# Patient Record
Sex: Male | Born: 1945 | Race: Black or African American | Hispanic: No | Smoking: Never smoker
Health system: Southern US, Community
[De-identification: ages and names within clinical notes are randomized; demographics above are authoritative.]

## PROBLEM LIST (undated history)

## (undated) DIAGNOSIS — I639 Cerebral infarction, unspecified: Secondary | ICD-10-CM

---

## 2000-10-29 ENCOUNTER — Emergency Department (HOSPITAL_COMMUNITY): Admission: EM | Admit: 2000-10-29 | Discharge: 2000-10-29 | Payer: Self-pay | Admitting: Emergency Medicine

## 2003-08-13 ENCOUNTER — Encounter: Payer: Self-pay | Admitting: Family Medicine

## 2003-08-13 ENCOUNTER — Encounter: Admission: RE | Admit: 2003-08-13 | Discharge: 2003-08-13 | Payer: Self-pay | Admitting: Family Medicine

## 2003-09-07 ENCOUNTER — Encounter: Admission: RE | Admit: 2003-09-07 | Discharge: 2003-09-07 | Payer: Self-pay | Admitting: Family Medicine

## 2003-09-14 ENCOUNTER — Encounter: Admission: RE | Admit: 2003-09-14 | Discharge: 2003-09-14 | Payer: Self-pay | Admitting: Family Medicine

## 2003-09-29 ENCOUNTER — Encounter: Admission: RE | Admit: 2003-09-29 | Discharge: 2003-09-29 | Payer: Self-pay | Admitting: Family Medicine

## 2004-02-11 ENCOUNTER — Encounter: Admission: RE | Admit: 2004-02-11 | Discharge: 2004-02-11 | Payer: Self-pay | Admitting: Emergency Medicine

## 2004-07-24 ENCOUNTER — Emergency Department (HOSPITAL_COMMUNITY): Admission: EM | Admit: 2004-07-24 | Discharge: 2004-07-24 | Payer: Self-pay | Admitting: Emergency Medicine

## 2008-07-10 ENCOUNTER — Emergency Department (HOSPITAL_COMMUNITY): Admission: EM | Admit: 2008-07-10 | Discharge: 2008-07-10 | Payer: Self-pay | Admitting: Emergency Medicine

## 2008-08-30 ENCOUNTER — Emergency Department (HOSPITAL_COMMUNITY): Admission: EM | Admit: 2008-08-30 | Discharge: 2008-08-30 | Payer: Self-pay | Admitting: Emergency Medicine

## 2010-08-12 ENCOUNTER — Emergency Department (HOSPITAL_COMMUNITY): Admission: EM | Admit: 2010-08-12 | Discharge: 2010-08-12 | Payer: Self-pay | Admitting: Emergency Medicine

## 2010-09-04 ENCOUNTER — Ambulatory Visit: Payer: Self-pay | Admitting: Internal Medicine

## 2010-09-04 ENCOUNTER — Inpatient Hospital Stay (HOSPITAL_COMMUNITY): Admission: EM | Admit: 2010-09-04 | Discharge: 2010-09-08 | Payer: Self-pay | Admitting: Emergency Medicine

## 2010-09-04 ENCOUNTER — Ambulatory Visit: Payer: Self-pay | Admitting: Cardiology

## 2010-09-05 ENCOUNTER — Encounter: Payer: Self-pay | Admitting: Internal Medicine

## 2010-09-06 ENCOUNTER — Ambulatory Visit: Payer: Self-pay | Admitting: Physical Medicine & Rehabilitation

## 2010-09-07 ENCOUNTER — Encounter: Payer: Self-pay | Admitting: Cardiology

## 2010-09-08 ENCOUNTER — Inpatient Hospital Stay (HOSPITAL_COMMUNITY)
Admission: RE | Admit: 2010-09-08 | Discharge: 2010-09-27 | Payer: Self-pay | Admitting: Physical Medicine & Rehabilitation

## 2010-09-08 ENCOUNTER — Encounter: Payer: Self-pay | Admitting: Internal Medicine

## 2010-09-08 DIAGNOSIS — I69959 Hemiplegia and hemiparesis following unspecified cerebrovascular disease affecting unspecified side: Secondary | ICD-10-CM | POA: Insufficient documentation

## 2010-09-09 ENCOUNTER — Ambulatory Visit: Payer: Self-pay | Admitting: Physical Medicine & Rehabilitation

## 2010-10-16 ENCOUNTER — Ambulatory Visit: Payer: Self-pay | Admitting: Internal Medicine

## 2010-10-16 DIAGNOSIS — M549 Dorsalgia, unspecified: Secondary | ICD-10-CM | POA: Insufficient documentation

## 2010-10-16 DIAGNOSIS — E785 Hyperlipidemia, unspecified: Secondary | ICD-10-CM | POA: Insufficient documentation

## 2010-10-18 ENCOUNTER — Encounter
Admission: RE | Admit: 2010-10-18 | Discharge: 2010-10-24 | Payer: Self-pay | Source: Home / Self Care | Attending: Physical Medicine & Rehabilitation | Admitting: Physical Medicine & Rehabilitation

## 2010-10-24 ENCOUNTER — Encounter
Admission: RE | Admit: 2010-10-24 | Discharge: 2010-12-12 | Payer: Self-pay | Source: Home / Self Care | Attending: Physical Medicine & Rehabilitation | Admitting: Physical Medicine & Rehabilitation

## 2010-10-24 ENCOUNTER — Ambulatory Visit: Payer: Self-pay | Admitting: Physical Medicine & Rehabilitation

## 2010-11-15 ENCOUNTER — Encounter
Admission: RE | Admit: 2010-11-15 | Discharge: 2010-12-12 | Payer: Self-pay | Source: Home / Self Care | Attending: Physical Medicine & Rehabilitation | Admitting: Physical Medicine & Rehabilitation

## 2010-12-02 ENCOUNTER — Encounter: Payer: Self-pay | Admitting: Family Medicine

## 2010-12-12 NOTE — Assessment & Plan Note (Signed)
Summary: NEW HFU-/CFB   Vital Signs:  Patient profile:   65 year old male Weight:      145.9 pounds (66.32 kg) Temp:     97.1 degrees F (36.17 degrees C) oral Pulse rate:   79 / minute BP sitting:   106 / 78  (left arm)  Vitals Entered By: Stanton Kidney Ditzler RN (October 16, 2010 9:03 AM) Is Patient Diabetic? No Pain Assessment Patient in pain? no      Nutritional Status Detail appetite good  Have you ever been in a relationship where you felt threatened, hurt or afraid?denies   Does patient need assistance? Functional Status Self care Ambulation Wheelchair Comments Family with pt. New pt IMC - HFU - no change with walking on right side.   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  He was discharged from CIR on 09/27/2010 and has been doing well.  He still has weakness on right side of his body.  He does not have any more physical therapy session, eating soft diet per speech therapist and tolerating it well.  He is taking Tylenol for his backpain and ASA 325mg  for his stroke. No other complaints.   Depression History:      The patient denies a depressed mood most of the day and a diminished interest in his usual daily activities.         Preventive Screening-Counseling & Management  Alcohol-Tobacco     Smoking Status: quit  Allergies (verified): No Known Drug Allergies  Social History: Smoking Status:  quit  Review of Systems  The patient denies anorexia, fever, weight loss, weight gain, vision loss, decreased hearing, hoarseness, chest pain, syncope, dyspnea on exertion, peripheral edema, prolonged cough, headaches, hemoptysis, abdominal pain, melena, hematochezia, severe indigestion/heartburn, hematuria, incontinence, genital sores, muscle weakness, suspicious skin lesions, transient blindness, difficulty walking, depression, unusual weight change,  abnormal bleeding, enlarged lymph nodes, angioedema, breast masses, and testicular masses.         right sided weakness s/p CVA on 08/2010  Physical Exam  General:  alert, well-developed, well-nourished, and well-hydrated.  alert, well-developed, well-nourished, and well-hydrated.   Head:  Right facial droop with drooling  Lungs:  normal respiratory effort, no intercostal retractions, no accessory muscle use, normal breath sounds, no crackles, and no wheezes.  normal respiratory effort, no intercostal retractions, no accessory muscle use, normal breath sounds, no crackles, and no wheezes.   Heart:  normal rate, regular rhythm, no murmur, no gallop, and no rub.  normal rate, regular rhythm, no murmur, no gallop, and no rub.   Abdomen:  soft, non-tender, normal bowel sounds, no distention, no masses, and no guarding.  soft, non-tender, normal bowel sounds, no distention, no masses, and no guarding.   Pulses:  R and L carotid,radial,femoral,dorsalis pedis and posterior tibial pulses are full and equal bilaterally Extremities:  No clubbing, cyanosis, edema, or deformity noted with normal full range of motion of all joints.   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  He was recently discharged from CIR and received 6 sessions of PT/OT at home.  I think he will benefit  from more  PT/OT sessions so we will refer him to [facility] center.   Will continue ASA 325mg  by mouth once daily   His updated medication list for this problem includes:    Aspirin 325 Mg Tabs (Aspirin) .Marland Kitchen... Take 1 tablet by mouth daily  His updated medication list for this problem includes:    Aspirin 325 Mg Tabs (Aspirin) .Marland Kitchen... Take 1 tablet  by mouth daily  Orders: Physical Therapy Referral (PT)  Problem # 2:  BACK PAIN, LUMBOSACRAL, CHRONIC (ICD-724.5) I reviewed his MR spine performed on 08/2010 which showed disc bulge and DJD on L4-S1 without any neural compression.  Will continue Tylenol 650mg  for pain as needed for now because it is controlling his pain ok.  He was evaluated by neurosurgeon around the same time when he had his stroke.  May need to go back to neurosurgeon if his pain progressively gets worse.   His updated medication list for this problem includes:    Aspirin 325 Mg Tabs (Aspirin) .Marland Kitchen... Take 1 tablet by mouth daily  His updated medication list for this problem includes:    Aspirin 325 Mg Tabs (Aspirin) .Marland Kitchen... Take 1 tablet by mouth daily  Problem # 3:  HYPERLIPIDEMIA (ICD-272.4) Lipid panel in 08/2010 showed LDL 140 and Total chol 214.  Will continue Crestor 40mg  at bedtime   His updated medication list for this problem includes:    Crestor 40 Mg Tabs (Rosuvastatin calcium) .Marland Kitchen... 1 tablet by mouth daily at bed time  His updated medication list for this problem includes:    Crestor 40 Mg Tabs (Rosuvastatin calcium) .Marland Kitchen... 1 tablet by mouth daily at bed time  Complete Medication List: 1)  Aspirin 325 Mg Tabs (Aspirin) .... Take 1 tablet by mouth daily 2)  Tylenol 650 Mg Tabs (acetaminophen)  .... Take 1 tablet by mouth every 4 hours as needed for pain 3)  Flonase 50 Mcg/act Susp (Fluticasone propionate) .Marland Kitchen.. 1 puff in each nostril daily as needed for allergy 4)  Crestor 40 Mg Tabs (Rosuvastatin calcium) .Marland Kitchen.. 1 tablet by mouth daily at bed time 5)  Omeprazole 20 Mg Cpdr (Omeprazole) .... Take 1 tablet by mouth daily for acid reflux  Patient Instructions: 1)  [facility] 2)  Continue taking current medications 3)  follow up in 3-4 months Prescriptions: OMEPRAZOLE 20 MG CPDR (OMEPRAZOLE) take 1 tablet by mouth daily for acid reflux  #30 x 3   Entered and Authorized by:    Rosana Berger MD   Signed by:   Rosana Berger MD on 10/16/2010   Method used:   Print then Give to Patient   RxID:   [id number] CRESTOR 40 MG TABS (ROSUVASTATIN CALCIUM) 1 tablet by mouth daily at bed time  #30 x 11   Entered and Authorized by:   Rosana Berger MD   Signed by:   Rosana Berger MD on 10/16/2010   Method used:   Print then Give to Patient   RxID:   [id number] FLONASE 50 MCG/ACT SUSP (FLUTICASONE PROPIONATE) 1 puff in each nostril daily as needed for allergy  #1 x 3   Entered and Authorized by:   Rosana Berger MD   Signed by:   Rosana Berger MD on 10/16/2010   Method used:   Print then Give to Patient   RxID:   [id number] TYLENOL 650 MG TABS (ACETAMINOPHEN) take 1 tablet by mouth every 4 hours as needed for pain  #60 x 3  Entered and Authorized by:   Rosana Berger MD   Signed by:   Rosana Berger MD on 10/16/2010   Method used:   Print then Give to Patient   RxID:   [id number] ASPIRIN 325 MG TABS (ASPIRIN) take 1 tablet by mouth daily  #30 x 11   Entered and Authorized by:   Rosana Berger MD   Signed by:   Rosana Berger MD on 10/16/2010   Method used:   Print then Give to Patient   RxID:   [id number]    Orders Added: 1)  Est. Patient Level III [88416] 2)  Physical Therapy Referral [PT]    Prevention & Chronic Care Immunizations   Influenza vaccine: Not documented    Tetanus booster: Not documented    Pneumococcal vaccine: Not documented    H. zoster vaccine: Not documented  Colorectal Screening   Hemoccult: Not documented    Colonoscopy: Not documented  Other Screening   PSA: Not documented   Smoking status: quit  (10/16/2010)  Lipids   Total Cholesterol: Not documented   LDL: Not documented   LDL Direct: Not documented   HDL: Not documented   Triglycerides: Not documented    SGOT (AST): Not documented   SGPT (ALT): Not documented   Alkaline phosphatase: Not documented   Total bilirubin: Not documented  Self-Management Support  :    Lipid self-management support: Resources for patients handout  (10/16/2010)        Resource handout printed.

## 2010-12-14 ENCOUNTER — Ambulatory Visit
Payer: No Typology Code available for payment source | Attending: Physical Medicine & Rehabilitation | Admitting: Occupational Therapy

## 2010-12-14 ENCOUNTER — Ambulatory Visit: Payer: No Typology Code available for payment source | Admitting: Physical Therapy

## 2010-12-14 DIAGNOSIS — R1310 Dysphagia, unspecified: Secondary | ICD-10-CM | POA: Insufficient documentation

## 2010-12-14 DIAGNOSIS — Z5189 Encounter for other specified aftercare: Secondary | ICD-10-CM | POA: Insufficient documentation

## 2010-12-14 DIAGNOSIS — I69919 Unspecified symptoms and signs involving cognitive functions following unspecified cerebrovascular disease: Secondary | ICD-10-CM | POA: Insufficient documentation

## 2010-12-14 DIAGNOSIS — I69922 Dysarthria following unspecified cerebrovascular disease: Secondary | ICD-10-CM | POA: Insufficient documentation

## 2010-12-14 DIAGNOSIS — I69998 Other sequelae following unspecified cerebrovascular disease: Secondary | ICD-10-CM | POA: Insufficient documentation

## 2010-12-14 DIAGNOSIS — R5381 Other malaise: Secondary | ICD-10-CM | POA: Insufficient documentation

## 2010-12-14 DIAGNOSIS — I69991 Dysphagia following unspecified cerebrovascular disease: Secondary | ICD-10-CM | POA: Insufficient documentation

## 2010-12-14 DIAGNOSIS — M6281 Muscle weakness (generalized): Secondary | ICD-10-CM | POA: Insufficient documentation

## 2010-12-14 DIAGNOSIS — R279 Unspecified lack of coordination: Secondary | ICD-10-CM | POA: Insufficient documentation

## 2010-12-14 DIAGNOSIS — R269 Unspecified abnormalities of gait and mobility: Secondary | ICD-10-CM | POA: Insufficient documentation

## 2010-12-14 NOTE — Miscellaneous (Signed)
Summary: INPATIENT REHABILITATION  INPATIENT REHABILITATION   Imported By: Shon Hough 10/26/2010 09:23:47  _____________________________________________________________________  External Attachment:    Type:   Image     Comment:   External Document

## 2010-12-19 ENCOUNTER — Ambulatory Visit: Payer: No Typology Code available for payment source | Admitting: Occupational Therapy

## 2010-12-19 ENCOUNTER — Ambulatory Visit: Payer: No Typology Code available for payment source | Admitting: Physical Therapy

## 2010-12-21 ENCOUNTER — Ambulatory Visit: Payer: Self-pay | Admitting: Physical Therapy

## 2010-12-21 ENCOUNTER — Encounter: Payer: Self-pay | Admitting: Occupational Therapy

## 2010-12-25 ENCOUNTER — Ambulatory Visit: Payer: No Typology Code available for payment source | Admitting: Speech Pathology

## 2010-12-25 ENCOUNTER — Ambulatory Visit: Payer: Self-pay | Admitting: Physical Medicine & Rehabilitation

## 2010-12-27 ENCOUNTER — Ambulatory Visit: Payer: No Typology Code available for payment source | Admitting: Physical Medicine & Rehabilitation

## 2010-12-27 ENCOUNTER — Encounter: Payer: No Typology Code available for payment source | Attending: Physical Medicine & Rehabilitation

## 2010-12-27 DIAGNOSIS — M5137 Other intervertebral disc degeneration, lumbosacral region: Secondary | ICD-10-CM

## 2010-12-27 DIAGNOSIS — I69922 Dysarthria following unspecified cerebrovascular disease: Secondary | ICD-10-CM | POA: Insufficient documentation

## 2010-12-27 DIAGNOSIS — R209 Unspecified disturbances of skin sensation: Secondary | ICD-10-CM

## 2010-12-27 DIAGNOSIS — I69998 Other sequelae following unspecified cerebrovascular disease: Secondary | ICD-10-CM | POA: Insufficient documentation

## 2010-12-27 DIAGNOSIS — M545 Low back pain, unspecified: Secondary | ICD-10-CM | POA: Insufficient documentation

## 2010-12-27 DIAGNOSIS — F329 Major depressive disorder, single episode, unspecified: Secondary | ICD-10-CM

## 2010-12-27 DIAGNOSIS — IMO0002 Reserved for concepts with insufficient information to code with codable children: Secondary | ICD-10-CM | POA: Insufficient documentation

## 2010-12-27 DIAGNOSIS — M6281 Muscle weakness (generalized): Secondary | ICD-10-CM | POA: Insufficient documentation

## 2010-12-27 DIAGNOSIS — I6330 Cerebral infarction due to thrombosis of unspecified cerebral artery: Secondary | ICD-10-CM

## 2011-01-03 ENCOUNTER — Ambulatory Visit: Payer: No Typology Code available for payment source | Admitting: Speech Pathology

## 2011-01-04 ENCOUNTER — Ambulatory Visit: Payer: No Typology Code available for payment source

## 2011-01-08 ENCOUNTER — Encounter: Payer: No Typology Code available for payment source | Admitting: Speech Pathology

## 2011-01-10 ENCOUNTER — Encounter: Payer: No Typology Code available for payment source | Admitting: Speech Pathology

## 2011-01-15 ENCOUNTER — Encounter: Payer: No Typology Code available for payment source | Admitting: Speech Pathology

## 2011-01-17 ENCOUNTER — Ambulatory Visit
Payer: No Typology Code available for payment source | Attending: Physical Medicine & Rehabilitation | Admitting: Speech Pathology

## 2011-01-17 DIAGNOSIS — M6281 Muscle weakness (generalized): Secondary | ICD-10-CM | POA: Insufficient documentation

## 2011-01-17 DIAGNOSIS — I69998 Other sequelae following unspecified cerebrovascular disease: Secondary | ICD-10-CM | POA: Insufficient documentation

## 2011-01-17 DIAGNOSIS — I69991 Dysphagia following unspecified cerebrovascular disease: Secondary | ICD-10-CM | POA: Insufficient documentation

## 2011-01-17 DIAGNOSIS — Z5189 Encounter for other specified aftercare: Secondary | ICD-10-CM | POA: Insufficient documentation

## 2011-01-17 DIAGNOSIS — R1310 Dysphagia, unspecified: Secondary | ICD-10-CM | POA: Insufficient documentation

## 2011-01-17 DIAGNOSIS — R269 Unspecified abnormalities of gait and mobility: Secondary | ICD-10-CM | POA: Insufficient documentation

## 2011-01-22 ENCOUNTER — Ambulatory Visit: Payer: No Typology Code available for payment source | Admitting: Speech Pathology

## 2011-01-23 LAB — GLUCOSE, CAPILLARY
Glucose-Capillary: 104 mg/dL — ABNORMAL HIGH (ref 70–99)
Glucose-Capillary: 106 mg/dL — ABNORMAL HIGH (ref 70–99)
Glucose-Capillary: 107 mg/dL — ABNORMAL HIGH (ref 70–99)
Glucose-Capillary: 112 mg/dL — ABNORMAL HIGH (ref 70–99)
Glucose-Capillary: 114 mg/dL — ABNORMAL HIGH (ref 70–99)
Glucose-Capillary: 115 mg/dL — ABNORMAL HIGH (ref 70–99)
Glucose-Capillary: 120 mg/dL — ABNORMAL HIGH (ref 70–99)
Glucose-Capillary: 124 mg/dL — ABNORMAL HIGH (ref 70–99)
Glucose-Capillary: 136 mg/dL — ABNORMAL HIGH (ref 70–99)
Glucose-Capillary: 90 mg/dL (ref 70–99)
Glucose-Capillary: 95 mg/dL (ref 70–99)
Glucose-Capillary: 98 mg/dL (ref 70–99)

## 2011-01-23 LAB — BASIC METABOLIC PANEL
BUN: 11 mg/dL (ref 6–23)
Calcium: 9.3 mg/dL (ref 8.4–10.5)
Creatinine, Ser: 1.02 mg/dL (ref 0.4–1.5)
GFR calc non Af Amer: >60 mL/min (ref >60)

## 2011-01-24 LAB — LIPID PANEL
Cholesterol: 214 mg/dL — ABNORMAL HIGH (ref 0–200)
LDL Cholesterol: 140 mg/dL — ABNORMAL HIGH (ref 0–99)
VLDL: 18 mg/dL (ref 0–40)

## 2011-01-24 LAB — GLUCOSE, CAPILLARY
Glucose-Capillary: 105 mg/dL — ABNORMAL HIGH (ref 70–99)
Glucose-Capillary: 131 mg/dL — ABNORMAL HIGH (ref 70–99)
Glucose-Capillary: 90 mg/dL (ref 70–99)
Glucose-Capillary: 91 mg/dL (ref 70–99)
Glucose-Capillary: 92 mg/dL (ref 70–99)
Glucose-Capillary: 95 mg/dL (ref 70–99)
Glucose-Capillary: 98 mg/dL (ref 70–99)
Glucose-Capillary: 98 mg/dL (ref 70–99)

## 2011-01-24 LAB — COMPREHENSIVE METABOLIC PANEL
ALT: 42 U/L (ref 0–53)
AST: 27 U/L (ref 0–37)
Albumin: 3 g/dL — ABNORMAL LOW (ref 3.5–5.2)
Alkaline Phosphatase: 70 U/L (ref 39–117)
BUN: 9 mg/dL (ref 6–23)
CO2: 25 mEq/L (ref 19–32)
Chloride: 109 mEq/L (ref 96–112)
Chloride: 111 mEq/L (ref 96–112)
GFR calc Af Amer: >60 mL/min (ref >60)
GFR calc non Af Amer: >60 mL/min (ref >60)
GFR calc non Af Amer: >60 mL/min (ref >60)
Glucose, Bld: 87 mg/dL (ref 70–99)
Potassium: 4.3 mEq/L (ref 3.5–5.1)
Sodium: 141 mEq/L (ref 135–145)
Total Bilirubin: 0.5 mg/dL (ref 0.3–1.2)
Total Bilirubin: 0.7 mg/dL (ref 0.3–1.2)
Total Protein: 7.4 g/dL (ref 6.0–8.3)

## 2011-01-24 LAB — URINALYSIS, MICROSCOPIC ONLY
Bilirubin Urine: NEGATIVE
Glucose, UA: NEGATIVE mg/dL
Ketones, ur: NEGATIVE mg/dL
Protein, ur: NEGATIVE mg/dL
pH: 6 (ref 5.0–8.0)

## 2011-01-24 LAB — ANA: Anti Nuclear Antibody(ANA): NEGATIVE

## 2011-01-24 LAB — PROTIME-INR
INR: 1 (ref 0.00–1.49)
Prothrombin Time: 13.4 seconds (ref 11.6–15.2)

## 2011-01-24 LAB — URINE CULTURE
Colony Count: >=100000
Culture  Setup Time: 201110312248

## 2011-01-24 LAB — DIFFERENTIAL
Basophils Absolute: 0 10*3/uL (ref 0.0–0.1)
Eosinophils Absolute: 0.1 10*3/uL (ref 0.0–0.7)
Eosinophils Absolute: 0.1 10*3/uL (ref 0.0–0.7)
Eosinophils Relative: 2 % (ref 0–5)
Lymphs Abs: 2.3 10*3/uL (ref 0.7–4.0)
Lymphs Abs: 2.7 10*3/uL (ref 0.7–4.0)
Monocytes Absolute: 0.9 10*3/uL (ref 0.1–1.0)
Neutrophils Relative %: 51 % (ref 43–77)

## 2011-01-24 LAB — CBC
Hemoglobin: 13.2 g/dL (ref 13.0–17.0)
MCH: 30.9 pg (ref 26.0–34.0)
Platelets: 290 10*3/uL (ref 150–400)
RBC: 4.31 MIL/uL (ref 4.22–5.81)
RBC: 4.79 MIL/uL (ref 4.22–5.81)
WBC: 5.8 10*3/uL (ref 4.0–10.5)
WBC: 7.5 10*3/uL (ref 4.0–10.5)

## 2011-01-24 LAB — URINALYSIS, ROUTINE W REFLEX MICROSCOPIC
Ketones, ur: 15 mg/dL — AB
Nitrite: NEGATIVE
Protein, ur: NEGATIVE mg/dL
Urobilinogen, UA: 1 mg/dL (ref 0.0–1.0)

## 2011-01-24 LAB — SEDIMENTATION RATE: Sed Rate: 25 mm/hr — ABNORMAL HIGH (ref 0–16)

## 2011-01-24 LAB — BASIC METABOLIC PANEL
CO2: 24 mEq/L (ref 19–32)
Calcium: 10 mg/dL (ref 8.4–10.5)
Creatinine, Ser: 1.03 mg/dL (ref 0.4–1.5)
GFR calc Af Amer: >60 mL/min (ref >60)
GFR calc non Af Amer: >60 mL/min (ref >60)

## 2011-01-24 LAB — CK TOTAL AND CKMB (NOT AT ARMC): CK, MB: 2 ng/mL (ref 0.3–4.0)

## 2011-01-24 LAB — HEMOGLOBIN A1C: Mean Plasma Glucose: 131 mg/dL — ABNORMAL HIGH (ref <117)

## 2011-01-24 LAB — TROPONIN I: Troponin I: <0.01 ng/mL (ref 0.00–0.06)

## 2011-01-25 ENCOUNTER — Ambulatory Visit: Payer: No Typology Code available for payment source | Admitting: Physical Therapy

## 2011-01-25 LAB — POCT I-STAT, CHEM 8
Calcium, Ion: 1.21 mmol/L (ref 1.12–1.32)
HCT: 45 % (ref 39.0–52.0)
Hemoglobin: 15.3 g/dL (ref 13.0–17.0)
Sodium: 144 mEq/L (ref 135–145)
TCO2: 28 mmol/L (ref 0–100)

## 2011-01-25 LAB — URINALYSIS, ROUTINE W REFLEX MICROSCOPIC
Glucose, UA: NEGATIVE mg/dL
Ketones, ur: NEGATIVE mg/dL
Protein, ur: NEGATIVE mg/dL
Urobilinogen, UA: 0.2 mg/dL (ref 0.0–1.0)

## 2011-01-29 ENCOUNTER — Encounter: Payer: No Typology Code available for payment source | Admitting: Speech Pathology

## 2011-02-02 ENCOUNTER — Ambulatory Visit (HOSPITAL_BASED_OUTPATIENT_CLINIC_OR_DEPARTMENT_OTHER): Payer: No Typology Code available for payment source | Admitting: Physical Medicine & Rehabilitation

## 2011-02-02 ENCOUNTER — Encounter: Payer: No Typology Code available for payment source | Attending: Physical Medicine & Rehabilitation

## 2011-02-02 DIAGNOSIS — M1710 Unilateral primary osteoarthritis, unspecified knee: Secondary | ICD-10-CM

## 2011-02-02 DIAGNOSIS — M5137 Other intervertebral disc degeneration, lumbosacral region: Secondary | ICD-10-CM | POA: Insufficient documentation

## 2011-02-02 DIAGNOSIS — M51379 Other intervertebral disc degeneration, lumbosacral region without mention of lumbar back pain or lower extremity pain: Secondary | ICD-10-CM | POA: Insufficient documentation

## 2011-02-02 DIAGNOSIS — I69993 Ataxia following unspecified cerebrovascular disease: Secondary | ICD-10-CM | POA: Insufficient documentation

## 2011-02-02 DIAGNOSIS — M545 Low back pain, unspecified: Secondary | ICD-10-CM | POA: Insufficient documentation

## 2011-02-02 DIAGNOSIS — I69998 Other sequelae following unspecified cerebrovascular disease: Secondary | ICD-10-CM | POA: Insufficient documentation

## 2011-02-02 DIAGNOSIS — R1310 Dysphagia, unspecified: Secondary | ICD-10-CM | POA: Insufficient documentation

## 2011-02-02 DIAGNOSIS — IMO0002 Reserved for concepts with insufficient information to code with codable children: Secondary | ICD-10-CM | POA: Insufficient documentation

## 2011-02-02 DIAGNOSIS — M25569 Pain in unspecified knee: Secondary | ICD-10-CM | POA: Insufficient documentation

## 2011-02-02 DIAGNOSIS — R5381 Other malaise: Secondary | ICD-10-CM | POA: Insufficient documentation

## 2011-02-02 DIAGNOSIS — R269 Unspecified abnormalities of gait and mobility: Secondary | ICD-10-CM

## 2011-02-02 DIAGNOSIS — I6330 Cerebral infarction due to thrombosis of unspecified cerebral artery: Secondary | ICD-10-CM

## 2011-02-06 ENCOUNTER — Other Ambulatory Visit: Payer: Self-pay | Admitting: Physical Medicine & Rehabilitation

## 2011-02-06 DIAGNOSIS — R270 Ataxia, unspecified: Secondary | ICD-10-CM

## 2011-02-06 DIAGNOSIS — R51 Headache: Secondary | ICD-10-CM

## 2011-02-07 ENCOUNTER — Ambulatory Visit: Payer: No Typology Code available for payment source | Admitting: Physical Therapy

## 2011-02-07 ENCOUNTER — Ambulatory Visit: Payer: No Typology Code available for payment source

## 2011-02-08 ENCOUNTER — Ambulatory Visit (HOSPITAL_COMMUNITY)
Admission: RE | Admit: 2011-02-08 | Discharge: 2011-02-08 | Disposition: A | Discharge status: Home / Self Care | Payer: No Typology Code available for payment source | Source: Ambulatory Visit | Attending: Physical Medicine & Rehabilitation | Admitting: Physical Medicine & Rehabilitation

## 2011-02-08 DIAGNOSIS — Z8673 Personal history of transient ischemic attack (TIA), and cerebral infarction without residual deficits: Secondary | ICD-10-CM | POA: Insufficient documentation

## 2011-02-08 DIAGNOSIS — R51 Headache: Secondary | ICD-10-CM

## 2011-02-08 DIAGNOSIS — R5381 Other malaise: Secondary | ICD-10-CM | POA: Insufficient documentation

## 2011-02-08 DIAGNOSIS — R270 Ataxia, unspecified: Secondary | ICD-10-CM

## 2011-02-08 DIAGNOSIS — R109 Unspecified abdominal pain: Secondary | ICD-10-CM | POA: Insufficient documentation

## 2011-02-08 DIAGNOSIS — R4789 Other speech disturbances: Secondary | ICD-10-CM | POA: Insufficient documentation

## 2011-02-09 ENCOUNTER — Ambulatory Visit: Payer: No Typology Code available for payment source | Admitting: Physical Therapy

## 2011-02-09 ENCOUNTER — Ambulatory Visit: Payer: No Typology Code available for payment source

## 2011-02-12 ENCOUNTER — Ambulatory Visit: Payer: No Typology Code available for payment source | Admitting: Physical Therapy

## 2011-02-12 ENCOUNTER — Ambulatory Visit: Payer: No Typology Code available for payment source | Attending: Physical Medicine & Rehabilitation

## 2011-02-12 DIAGNOSIS — I69998 Other sequelae following unspecified cerebrovascular disease: Secondary | ICD-10-CM | POA: Insufficient documentation

## 2011-02-12 DIAGNOSIS — I69922 Dysarthria following unspecified cerebrovascular disease: Secondary | ICD-10-CM | POA: Insufficient documentation

## 2011-02-12 DIAGNOSIS — M6281 Muscle weakness (generalized): Secondary | ICD-10-CM | POA: Insufficient documentation

## 2011-02-12 DIAGNOSIS — I69991 Dysphagia following unspecified cerebrovascular disease: Secondary | ICD-10-CM | POA: Insufficient documentation

## 2011-02-12 DIAGNOSIS — R279 Unspecified lack of coordination: Secondary | ICD-10-CM | POA: Insufficient documentation

## 2011-02-12 DIAGNOSIS — Z5189 Encounter for other specified aftercare: Secondary | ICD-10-CM | POA: Insufficient documentation

## 2011-02-12 DIAGNOSIS — I69919 Unspecified symptoms and signs involving cognitive functions following unspecified cerebrovascular disease: Secondary | ICD-10-CM | POA: Insufficient documentation

## 2011-02-12 DIAGNOSIS — R269 Unspecified abnormalities of gait and mobility: Secondary | ICD-10-CM | POA: Insufficient documentation

## 2011-02-12 DIAGNOSIS — R1310 Dysphagia, unspecified: Secondary | ICD-10-CM | POA: Insufficient documentation

## 2011-02-12 DIAGNOSIS — R5381 Other malaise: Secondary | ICD-10-CM | POA: Insufficient documentation

## 2011-02-14 ENCOUNTER — Ambulatory Visit: Payer: No Typology Code available for payment source | Admitting: Physical Therapy

## 2011-02-19 ENCOUNTER — Ambulatory Visit: Payer: No Typology Code available for payment source | Admitting: Physical Therapy

## 2011-03-05 ENCOUNTER — Encounter
Payer: No Typology Code available for payment source | Attending: Physical Medicine & Rehabilitation | Admitting: Physical Medicine & Rehabilitation

## 2011-03-05 DIAGNOSIS — M47817 Spondylosis without myelopathy or radiculopathy, lumbosacral region: Secondary | ICD-10-CM | POA: Insufficient documentation

## 2011-03-05 DIAGNOSIS — I69998 Other sequelae following unspecified cerebrovascular disease: Secondary | ICD-10-CM | POA: Insufficient documentation

## 2011-03-05 DIAGNOSIS — I69919 Unspecified symptoms and signs involving cognitive functions following unspecified cerebrovascular disease: Secondary | ICD-10-CM | POA: Insufficient documentation

## 2011-03-05 DIAGNOSIS — I6330 Cerebral infarction due to thrombosis of unspecified cerebral artery: Secondary | ICD-10-CM

## 2011-03-05 DIAGNOSIS — M1710 Unilateral primary osteoarthritis, unspecified knee: Secondary | ICD-10-CM | POA: Insufficient documentation

## 2011-03-05 DIAGNOSIS — R1310 Dysphagia, unspecified: Secondary | ICD-10-CM | POA: Insufficient documentation

## 2011-03-05 DIAGNOSIS — M5137 Other intervertebral disc degeneration, lumbosacral region: Secondary | ICD-10-CM

## 2011-03-05 DIAGNOSIS — R269 Unspecified abnormalities of gait and mobility: Secondary | ICD-10-CM

## 2011-03-05 DIAGNOSIS — R5381 Other malaise: Secondary | ICD-10-CM | POA: Insufficient documentation

## 2011-03-06 NOTE — Assessment & Plan Note (Signed)
Anthony Heath is back regarding his thalamic stroke.  He continues to struggle at home.  He lacks any initiative to work on exercises.  He is incontinent of bladder at times.  He still has poor safety awareness. Pain overall is a bit improved with the Pamelor increased.  Family is now looking into the [facility] for placement options.  His knee has been better since we injected in March.  REVIEW OF SYSTEMS:  Notable for the above, as well as tremor, tingling, confusion, depression.  Full 12-point review is in the written health and history section of the chart.  The patient does have occasional coughing as well.  SOCIAL HISTORY:  The patient is divorced, living with sister and family.  PHYSICAL EXAMINATION:  VITAL SIGNS:  Blood pressure is 99/58, pulse is 111, respiratory rate 18, satting 94% on room air. GENERAL:  Patient is pleasant, alert, dysarthric.  He knows the month and year, but not date.  He has poor insight awareness, memory, etc.  He remains ataxic on the right and really has continued weakness in the range of 1+ and 2-out of 5 on the right.  Knee is much less tender and swollen today with no pain with passive movements.  He still has some crepitus there.  He has poor sitting posture.  He is right central VII.  ASSESSMENT: 1. 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 2. Dysphagia. 3. 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 4. Osteoarthritis, right knee responding nicely to injection.  PLAN: 1. [facility]. 2. We will stay with his Pamelor 25 mg at night for sleep and pain.     He seems to be doing better with this.  We discussed the fact that     some of the swelling he feels in the right upper extremity is     simply the phenomenon of his arm and leg being slightly numb and     sensation that they are "swollen." 3.  Stay with the Effexor dose 75 b.i.d. 4. I will see the patient back in about 6 months' time.     Ranelle Oyster, M.D. Electronically Signed    ZTS/MedQ D:  03/05/2011 13:12:31  T:  03/06/2011 00:38:04  Job #:  [id number]

## 2011-08-14 LAB — URINALYSIS, ROUTINE W REFLEX MICROSCOPIC
Ketones, ur: 15 — AB
Leukocytes, UA: NEGATIVE
Nitrite: NEGATIVE
Specific Gravity, Urine: 1.039 — ABNORMAL HIGH
Urobilinogen, UA: 0.2

## 2011-08-14 LAB — CBC
HCT: 47.1
Platelets: 243
RDW: 15.4
WBC: 5.4

## 2011-08-14 LAB — DIFFERENTIAL
Basophils Absolute: 0
Eosinophils Absolute: 0
Eosinophils Relative: 0
Lymphocytes Relative: 20
Lymphs Abs: 1.1
Neutrophils Relative %: 68

## 2011-08-14 LAB — COMPREHENSIVE METABOLIC PANEL
AST: 18
Albumin: 3.1 — ABNORMAL LOW
BUN: 14
Creatinine, Ser: 1.04
GFR calc Af Amer: >60
Potassium: 3.5
Total Protein: 6.5

## 2011-08-14 LAB — LIPASE, BLOOD: Lipase: 26

## 2011-09-05 ENCOUNTER — Ambulatory Visit: Payer: No Typology Code available for payment source | Admitting: Physical Medicine & Rehabilitation

## 2014-07-19 ENCOUNTER — Emergency Department (HOSPITAL_COMMUNITY): Payer: No Typology Code available for payment source

## 2014-07-19 ENCOUNTER — Emergency Department (HOSPITAL_COMMUNITY)
Admission: EM | Admit: 2014-07-19 | Discharge: 2014-07-19 | Disposition: A | Discharge status: Home / Self Care | Payer: No Typology Code available for payment source | Attending: Emergency Medicine | Admitting: Emergency Medicine

## 2014-07-19 ENCOUNTER — Encounter (HOSPITAL_COMMUNITY): Payer: Self-pay | Admitting: Emergency Medicine

## 2014-07-19 DIAGNOSIS — R1310 Dysphagia, unspecified: Secondary | ICD-10-CM | POA: Diagnosis present

## 2014-07-19 DIAGNOSIS — R259 Unspecified abnormal involuntary movements: Secondary | ICD-10-CM | POA: Diagnosis not present

## 2014-07-19 DIAGNOSIS — Z8673 Personal history of transient ischemic attack (TIA), and cerebral infarction without residual deficits: Secondary | ICD-10-CM | POA: Diagnosis not present

## 2014-07-19 DIAGNOSIS — R05 Cough: Secondary | ICD-10-CM | POA: Insufficient documentation

## 2014-07-19 DIAGNOSIS — R059 Cough, unspecified: Secondary | ICD-10-CM | POA: Insufficient documentation

## 2014-07-19 DIAGNOSIS — R630 Anorexia: Secondary | ICD-10-CM | POA: Diagnosis not present

## 2014-07-19 DIAGNOSIS — Z79899 Other long term (current) drug therapy: Secondary | ICD-10-CM | POA: Diagnosis not present

## 2014-07-19 HISTORY — DX: Cerebral infarction, unspecified: I63.9

## 2014-07-19 LAB — COMPREHENSIVE METABOLIC PANEL
ALBUMIN: 2.6 g/dL — AB (ref 3.5–5.2)
ALT: 8 U/L (ref 0–53)
AST: 9 U/L (ref 0–37)
Alkaline Phosphatase: 80 U/L (ref 39–117)
Anion gap: 15 (ref 5–15)
BILIRUBIN TOTAL: 0.3 mg/dL (ref 0.3–1.2)
BUN: 6 mg/dL (ref 6–23)
CHLORIDE: 104 meq/L (ref 96–112)
CO2: 23 meq/L (ref 19–32)
Calcium: 9.8 mg/dL (ref 8.4–10.5)
Creatinine, Ser: 0.65 mg/dL (ref 0.50–1.35)
GFR calc Af Amer: >90 mL/min (ref >90)
Glucose, Bld: 85 mg/dL (ref 70–99)
Potassium: 3.5 mEq/L — ABNORMAL LOW (ref 3.7–5.3)
SODIUM: 142 meq/L (ref 137–147)
Total Protein: 7.9 g/dL (ref 6.0–8.3)

## 2014-07-19 LAB — CBC WITH DIFFERENTIAL/PLATELET
BASOS ABS: 0.1 10*3/uL (ref 0.0–0.1)
BASOS PCT: 1 % (ref 0–1)
Eosinophils Absolute: 0.3 10*3/uL (ref 0.0–0.7)
Eosinophils Relative: 4 % (ref 0–5)
HEMATOCRIT: 36.6 % — AB (ref 39.0–52.0)
Hemoglobin: 11.5 g/dL — ABNORMAL LOW (ref 13.0–17.0)
LYMPHS PCT: 40 % (ref 12–46)
Lymphs Abs: 2.8 10*3/uL (ref 0.7–4.0)
MCH: 26.5 pg (ref 26.0–34.0)
MCHC: 31.4 g/dL (ref 30.0–36.0)
MCV: 84.3 fL (ref 78.0–100.0)
Monocytes Absolute: 0.6 10*3/uL (ref 0.1–1.0)
Monocytes Relative: 8 % (ref 3–12)
NEUTROS ABS: 3.3 10*3/uL (ref 1.7–7.7)
NEUTROS PCT: 47 % (ref 43–77)
PLATELETS: 485 10*3/uL — AB (ref 150–400)
RBC: 4.34 MIL/uL (ref 4.22–5.81)
RDW: 19 % — AB (ref 11.5–15.5)
WBC: 7 10*3/uL (ref 4.0–10.5)

## 2014-07-19 NOTE — Discharge Instructions (Signed)
Continue his pureed diet as previously ordered. Encourage him to eat small, but frequent meals.   Dysphagia Swallowing problems (dysphagia) occur when solids and liquids seem to stick in your throat on the way down to your stomach, or the food takes longer to get to the stomach. Other symptoms include regurgitating food, noises coming from the throat, chest discomfort with swallowing, and a feeling of fullness or the feeling of something being stuck in your throat when swallowing. When blockage in your throat is complete, it may be associated with drooling. CAUSES  Problems with swallowing may occur because of problems with the muscles. The food cannot be propelled in the usual manner into your stomach. You may have ulcers, scar tissue, or inflammation in the tube down which food travels from your mouth to your stomach (esophagus), which blocks food from passing normally into the stomach. Causes of inflammation include:  Acid reflux from your stomach into your esophagus.  Infection.  Radiation treatment for cancer.  Medicines taken without enough fluids to wash them down into your stomach. You may have nerve problems that prevent signals from being sent to the muscles of your esophagus to contract and move your food down to your stomach. Globus pharyngeus is a relatively common problem in which there is a sense of an obstruction or difficulty in swallowing, without any physical abnormalities of the swallowing passages being found. This problem usually improves over time with reassurance and testing to rule out other causes. DIAGNOSIS Dysphagia can be diagnosed and its cause can be determined by tests in which you swallow a white substance that helps illuminate the inside of your throat (contrast medium) while X-rays are taken. Sometimes a flexible telescope that is inserted down your throat (endoscopy) to look at your esophagus and stomach is used. TREATMENT   If the dysphagia is caused by acid  reflux or infection, medicines may be used.  If the dysphagia is caused by problems with your swallowing muscles, swallowing therapy may be used to help you strengthen your swallowing muscles.  If the dysphagia is caused by a blockage or mass, procedures to remove the blockage may be done. HOME CARE INSTRUCTIONS  Try to eat soft food that is easier to swallow and check your weight on a daily basis to be sure that it is not decreasing.  Be sure to drink liquids when sitting upright (not lying down). SEEK MEDICAL CARE IF:  You are losing weight because you are unable to swallow.  You are coughing when you drink liquids (aspiration).  You are coughing up partially digested food. SEEK IMMEDIATE MEDICAL CARE IF:  You are unable to swallow your own saliva .  You are having shortness of breath or a fever, or both.  You have a hoarse voice along with difficulty swallowing. MAKE SURE YOU:  Understand these instructions.  Will watch your condition.  Will get help right away if you are not doing well or get worse. Document Released: 10/26/2000 Document Revised: 03/15/2014 Document Reviewed: 04/17/2013 [facility] Patient Information 2015 [place], [place]. This information is not intended to replace advice given to you by your health care provider. Make sure you discuss any questions you have with your health care provider.  Dysphagia Level 1 Diet, Pureed The dysphasia level 1 diet includes foods that are completely pureed and smooth. The foods have a pudding-like texture, such as the texture of pureed pancakes, mashed potatoes, and yogurt. The diet does not include foods with lumps or coarse textures. Liquids should  be smooth and may either be thin, nectar-thick, honey-like, or spoon-thick. This diet is helpful for people with moderate to severe swallowing problems. It reduces the risk of food getting caught in the windpipe, trachea, or lungs. You may need help or supervision during meals while  following this diet. WHAT DO I NEED TO KNOW ABOUT THIS DIET? Foods  You may eat foods that are soft and have a pudding-like texture. If a food does not have this texture, you may be able to eat the food after:  Pureeing it. This can be done with a blender or whisk.  Moistening it with liquid. For example, you may have bread if you soak it in milk or syrup.  Avoid foods that are hard, dry, sticky, chunky, lumpy, or stringy. Also avoid foods with nuts, seeds, raisins, skins, and pulp.  Do not eat foods that you have to chew. If you have to chew the food, then you cannot eat it.  Eat a variety of foods to get all the nutrients you need. Liquids  You may drink liquids that are smooth. Your health care provider will tell you if you should drink thin or thickened liquids.  To thicken a liquid, use a food and beverage thickener or a thickening food. Thickened liquids are usually a "pudding-like" consistency.  Thin liquids include fruit juices, milk, coffee, tea, yogurts, shakes, and similar foods that melt to thin liquid at room temperature.  Avoid liquids with seeds, pulp, or chunks. See your dietitian or health care provider regularly for help with your dietary changes. WHAT FOODS CAN I EAT? Grains Store-bought soft breads, pancakes, and [place] toast that have a smooth, moist texture and do not have nuts or seeds (you will need to moisten the food with liquid). Cooked cereals that have a pudding-like consistency, such as cream of wheat or farina (no oatmeal). Pureed, well-cooked pasta, rice, and plain bread stuffing. Vegetables Pureed vegetables. Soft avocado. Smooth tomato paste or sauce. Strained or pureed soups (these may need to be thickened as directed). Mashed or pureed potatoes without skin (can be seasoned with butter, smooth gravy, margarine, or sour cream). Fruits Pureed fruits such as melons and apples without seeds or pulp. Mashed bananas. Smooth tomato paste or sauce. Fruit  juices without pulp or seeds. Strained or pureed soups. Meat and Other Protein Sources Pureed meat. Smooth pate or liverwurst. Smooth souffles. Pureed beans (such as lentils). Pureed eggs. Dairy Yogurt. Smooth cheese sauces. Milk (may need to be thickened). Nutritional dairy drinks or shakes. Ask your health care provider whether you can have ice cream. Condiments Finely ground salt, pepper, and other ground spices. Sweets/Desserts Smooth puddings and custards. Pureed desserts. Souffles. Whipped topping. Ask your health care provider whether you can have frozen desserts. Fats and Oils Butter. Margarine. Smooth and strained gravy. Sour cream. Mayonnaise. Cream cheese. Whipped topping. Smooth sauces (such as white sauce, cheese sauce, or hollandaise sauce). The items listed above may not be a complete list of recommended foods or beverages. Contact your dietitian for more options. WHAT FOODS ARE NOT RECOMMENDED? Grains Oatmeal. Dry cereals. Hard breads. Vegetables Whole vegetables. Stringy vegetables (such as celery). Thin tomato sauce. Fruits Whole fresh, frozen, canned, or dried fruits that have not been pureed. Stringy fruits (such as pineapple). Meat and Other Protein Sources Whole or ground meat, fish, or poultry. Dried or cooked lentils or legumes that have been cooked but not mashed or pureed. Non-pureed eggs. Nuts and seeds. Peanut butter. Dairy Non-pureed cheese. Dairy products  with lumps or chunks. Ask your health care provider whether you can have ice cream. Condiments Coarse or seeded herbs and spices. Sweets/Desserts [place] preserves. Jams with seeds. Solid desserts. Sticky, chewy sweets (such as licorice and caramel). Ask your health care provider whether you can have frozen desserts. Fats and Oils Sauces of fats with lumps or chunks. The items listed above may not be a complete list of foods and beverages to avoid. Contact your dietitian for more information. Document  Released: 10/29/2005 Document Revised: 03/15/2014 Document Reviewed: 10/12/2013 [facility] Patient Information 2015 [place], [place]. This information is not intended to replace advice given to you by your health care provider. Make sure you discuss any questions you have with your health care provider.

## 2014-07-19 NOTE — ED Notes (Signed)
Patient transported to CT 

## 2014-07-19 NOTE — ED Notes (Signed)
Bed: WA09 Expected date:  Expected time:  Means of arrival:  Comments: EMS- difficulty swallowing, bedridden

## 2014-07-19 NOTE — ED Notes (Addendum)
Per EMS, Pt, from home, c/o difficulty swallowing x 3 days.  Denies pain.  EMS reports Pt is on a soft diet.  Pt's deficits are remaining from a stroke, last year.  Pt is cared for by family and an intermittent Home Care RN.  EMS sts Pt an answer "yes" and "no" questions.

## 2014-07-19 NOTE — ED Notes (Signed)
[facility] called for pt 

## 2014-07-19 NOTE — ED Provider Notes (Signed)
CSN: [id number]     Arrival date & time 07/19/14  1218 History   First MD Initiated Contact with Patient 07/19/14 1238     Chief Complaint  Patient presents with  . Dysphagia      HPI  Pt via EMS.  2 Sisters accompany him.  CC is less po intake, and concern for aspiration.  Pt with CVA over 2 years ago.  SLP eval/swallow study suggested pureed solids/thickened liquids.  He is compliant.  Has HHC RN 3 days/week. Family state that he has taken in less this week, and has a cough, productive of clear phlegm.  Normal Urine output.  No confusion of worsening of chronic lt hemiparesis, facial droop.  Past Medical History  Diagnosis Date  . Stroke    History reviewed. No pertinent past surgical history. History reviewed. No pertinent family history. History  Substance Use Topics  . Smoking status: Never Smoker   . Smokeless tobacco: Not on file  . Alcohol Use: No    Review of Systems  Constitutional: Positive for appetite change. Negative for fever, chills, diaphoresis and fatigue.  HENT: Negative for mouth sores, sore throat and trouble swallowing.   Eyes: Negative for visual disturbance.  Respiratory: Positive for cough. Negative for choking, chest tightness, shortness of breath and wheezing.   Cardiovascular: Negative for chest pain.  Gastrointestinal: Negative for nausea, vomiting, abdominal pain, diarrhea and abdominal distention.  Endocrine: Negative for polydipsia, polyphagia and polyuria.  Genitourinary: Negative for dysuria, frequency and hematuria.  Musculoskeletal: Negative for gait problem.  Skin: Negative for color change, pallor and rash.  Neurological: Negative for dizziness, syncope, light-headedness and headaches.  Hematological: Does not bruise/bleed easily.  Psychiatric/Behavioral: Negative for behavioral problems and confusion.      Allergies  Review of patient's allergies indicates not on file.  Home Medications   Prior to Admission medications    Medication Sig Start Date End Date Taking? Authorizing Provider  atorvastatin (LIPITOR) 80 MG tablet Take 80 mg by mouth daily.  04/20/14   Historical Provider, MD  omeprazole (PRILOSEC) 20 MG capsule Take 20 mg by mouth daily.  06/11/14   Historical Provider, MD   BP 92/52  Pulse 84  Temp(Src) 97.9 F (36.6 C) (Oral)  Resp 20  SpO2 97% Physical Exam  Constitutional: He is oriented to person, place, and time. He appears well-developed and well-nourished. No distress.  HENT:  Head: Normocephalic.  Moist mucous membranes. No drooling. Handling secretions well.  Eyes: Conjunctivae are normal. Pupils are equal, round, and reactive to light. No scleral icterus.  Neck: Normal range of motion. Neck supple. No thyromegaly present.  Cardiovascular: Normal rate and regular rhythm.  Exam reveals no gallop and no friction rub.   No murmur heard. Pulmonary/Chest: Effort normal and breath sounds normal. No respiratory distress. He has no wheezes. He has no rales.  Saturation 97%. Clear breath sounds without wheezing rales rhonchi or prolongation. Not tachypneic. No distress or increased work of breathing  Abdominal: Soft. Bowel sounds are normal. He exhibits no distension. There is no tenderness. There is no rebound.  Musculoskeletal: Normal range of motion.  Neurological: He is alert and oriented to person, place, and time.  Left upper extremity resting tremor. Flaccid right upper extremity.  Skin: Skin is warm and dry. No rash noted.  Psychiatric: He has a normal mood and affect. His behavior is normal.    ED Course  Procedures (including critical care time) Labs Review Labs Reviewed  CBC WITH DIFFERENTIAL -  Abnormal; Notable for the following:    Hemoglobin 11.5 (*)    HCT 36.6 (*)    RDW 19.0 (*)    Platelets 485 (*)    All other components within normal limits  COMPREHENSIVE METABOLIC PANEL - Abnormal; Notable for the following:    Potassium 3.5 (*)    Albumin 2.6 (*)    All other  components within normal limits    Imaging Review Ct Head Wo Contrast  07/19/2014   CLINICAL DATA:  Headache and difficulty swallowing.  EXAM: CT HEAD WITHOUT CONTRAST  TECHNIQUE: Contiguous axial images were obtained from the base of the skull through the vertex without intravenous contrast.  COMPARISON:  02/08/2011  FINDINGS: The ventricles and cisterns are within normal. There is mild atrophy with slight progression compared to the prior exam. There is moderate chronic ischemic microvascular disease with mild progression compared to the prior exam. There is no mass, mass effect, shift of midline structures or acute hemorrhage. There is no evidence to suggest acute infarction. Remaining bones soft tissues are unremarkable.  IMPRESSION: No acute intracranial findings.  Moderate chronic ischemic microvascular disease and mild atrophy both with slight progression compared to the prior exam.   Electronically Signed   By: Elberta Fortis M.D.   On: 07/19/2014 13:46   Dg Chest Port 1 View  07/19/2014   CLINICAL DATA:  Dysphagia.  EXAM: PORTABLE CHEST - 1 VIEW  COMPARISON:  None.  FINDINGS: The heart size and mediastinal contours are within normal limits. Lung volumes low. Insert line. The visualized skeletal structures are unremarkable.  IMPRESSION: No active disease.   Electronically Signed   By: Irish Lack M.D.   On: 07/19/2014 13:50     EKG Interpretation None      MDM   Final diagnoses:  Dysphagia    Mild worsening of chronic atrophy and microvascular changes without intercurrent cva.  Labs reassuring.  No aspiration on CXR.  Pt stable for DC.    Rolland Porter, MD 07/19/14 [phone]

## 2016-04-10 ENCOUNTER — Emergency Department (HOSPITAL_COMMUNITY)
Admission: EM | Admit: 2016-04-10 | Discharge: 2016-04-11 | Disposition: A | Discharge status: Home / Self Care | Payer: No Typology Code available for payment source | Attending: Emergency Medicine | Admitting: Emergency Medicine

## 2016-04-10 ENCOUNTER — Emergency Department (HOSPITAL_COMMUNITY): Payer: No Typology Code available for payment source

## 2016-04-10 ENCOUNTER — Encounter (HOSPITAL_COMMUNITY): Payer: Self-pay | Admitting: Emergency Medicine

## 2016-04-10 DIAGNOSIS — Z8669 Personal history of other diseases of the nervous system and sense organs: Secondary | ICD-10-CM | POA: Diagnosis not present

## 2016-04-10 DIAGNOSIS — R64 Cachexia: Secondary | ICD-10-CM | POA: Diagnosis not present

## 2016-04-10 DIAGNOSIS — R05 Cough: Secondary | ICD-10-CM | POA: Diagnosis not present

## 2016-04-10 DIAGNOSIS — Z8673 Personal history of transient ischemic attack (TIA), and cerebral infarction without residual deficits: Secondary | ICD-10-CM | POA: Diagnosis not present

## 2016-04-10 DIAGNOSIS — Z79899 Other long term (current) drug therapy: Secondary | ICD-10-CM | POA: Insufficient documentation

## 2016-04-10 DIAGNOSIS — R059 Cough, unspecified: Secondary | ICD-10-CM

## 2016-04-10 DIAGNOSIS — Z7982 Long term (current) use of aspirin: Secondary | ICD-10-CM | POA: Diagnosis not present

## 2016-04-10 NOTE — Discharge Instructions (Signed)

## 2016-04-10 NOTE — ED Notes (Signed)
Ems states family reports pt has been coughing today and sounded bad. Family wanted to make sure the pt didn't aspirate. Pt has a hx stroke

## 2016-04-10 NOTE — ED Notes (Signed)
MD at bedside. 

## 2016-04-10 NOTE — ED Provider Notes (Signed)
CSN: [id number]     Arrival date & time 04/10/16  2043 History   First MD Initiated Contact with Patient 04/10/16 2200     Chief Complaint  Patient presents with  . Cough     (Consider location/radiation/quality/duration/timing/severity/associated sxs/prior Treatment) HPI LEVEL 5 CAVEAT--Hx of CVA, minimally verbal.  History provided by sister.  Anthony Heath is a 70 y.o. male with PMH significant for CVA with right sided hemiparesis who presents with 1 day history of gradual onset, intermittent, productive cough (phelgm).  No fever.  Patient denies any pain at this time.  No worsening of chronic hemiparesis. Sister reports he does have an ENT doctor that he sees for dysphagia.  He eats pureed solids/thickened liquids.  He has been eating and drinking normally.  The coughing did not begin with eating. Lives at home with sister.   Past Medical History  Diagnosis Date  . Stroke [facility](HCC)    History reviewed. No pertinent past surgical history. History reviewed. No pertinent family history. Social History  Substance Use Topics  . Smoking status: Never Smoker   . Smokeless tobacco: None  . Alcohol Use: No    Review of Systems  Unable to perform ROS: Other (patient minimally verbal)       Allergies  Review of patient's allergies indicates not on file.  Home Medications   Prior to Admission medications   Medication Sig Start Date End Date Taking? Authorizing Provider  acetaminophen (TYLENOL) 325 MG tablet Take 650 mg by mouth every 6 (six) hours as needed for mild pain or fever.   Yes Historical Provider, MD  aspirin EC 81 MG tablet Take 81 mg by mouth every morning.   Yes Historical Provider, MD  atorvastatin (LIPITOR) 80 MG tablet Take 80 mg by mouth daily.  04/20/14  Yes Historical Provider, MD  latanoprost (XALATAN) 0.005 % ophthalmic solution Place 1 drop into both eyes at bedtime. 04/06/16  Yes Historical Provider, MD  omeprazole (PRILOSEC) 20 MG capsule Take 20 mg by mouth  daily.  06/11/14  Yes Historical Provider, MD  polyvinyl alcohol (LIQUIFILM TEARS) 1.4 % ophthalmic solution Place 1-2 drops into both eyes daily as needed for dry eyes.   Yes Historical Provider, MD  senna (SENOKOT) 8.6 MG TABS tablet Take 1-2 tablets by mouth daily as needed for mild constipation.   Yes Historical Provider, MD   BP 92/61 mmHg  Pulse 71  Temp(Src) 98.1 F (36.7 C) (Oral)  Resp 24  Ht 5\' 9"  (1.753 m)  Wt 65.772 kg  BMI 21.40 kg/m2  SpO2 100% Physical Exam  Constitutional: He appears cachectic.  70 year old male appears older that stated age, cachetic, will respond yes/no to some questions.  HENT:  Head: Normocephalic and atraumatic.  Mouth/Throat: Oropharynx is clear and moist.  Airway patent. Tolerating secretions without difficulty.   Eyes: Conjunctivae are normal. Pupils are equal, round, and reactive to light.  Neck: Normal range of motion. Neck supple.  Cardiovascular: Normal rate and regular rhythm.   No unilateral leg swelling.   Pulmonary/Chest: Effort normal. No accessory muscle usage or stridor. No respiratory distress. He has no wheezes. He has no rhonchi. He has no rales.  Oxygen saturation 100% on RA.   Abdominal: Soft. Bowel sounds are normal. He exhibits no distension. There is no tenderness. There is no rebound and no guarding.  Musculoskeletal: Normal range of motion.  Lymphadenopathy:    He has no cervical adenopathy.  Neurological: He is alert.  Skin: Skin is  warm and dry.  Psychiatric: He has a normal mood and affect. His behavior is normal.    ED Course  Procedures (including critical care time) Labs Review Labs Reviewed - No data to display  Imaging Review Dg Chest Portable 1 View  04/10/2016  CLINICAL DATA:  Acute onset of cough.  Initial encounter. EXAM: PORTABLE CHEST 1 VIEW COMPARISON:  Chest radiograph performed 07/19/2014 FINDINGS: The lungs are well-aerated. Pulmonary vascularity is at the upper limits of normal. Mild left midlung  opacity may reflect atelectasis. There is no evidence of pleural effusion or pneumothorax. The cardiomediastinal silhouette is within normal limits. No acute osseous abnormalities are seen. IMPRESSION: Mild left midlung opacity may reflect atelectasis. Lungs otherwise clear. Electronically Signed   By: Roanna Raider M.D.   On: 04/10/2016 23:17   I have personally reviewed and evaluated these images and lab results as part of my medical decision-making.   EKG Interpretation None      MDM   Final diagnoses:  Cough   Patient with hx of CVA with right sided hemiparesis presents with cough and concern for aspiration.  Patient acting at baseline.  Normal PO intake.  No fevers.  Denies pain at this time.  Does have ENT doctor.  On exam, patient appears cachetic.  VSS, NAD.  Airway patent. Handling secretions without difficulty.  Heart RRR, lungs with mild rhonchi, abdomen soft and benign.  Will obtain CXR to evaluate for possible aspiration PNA.  No indication for further imaging or labs at this time with normal vital signs and normal physical exam. CXR shows mild left midlung opacity likely atelectasis.  Lungs otherwise clear.  Follow up PCP and ENT.  Discussed return precautions.  Patient and caregiver agrees and acknowledges the above plan for discharge.   Case has been discussed with Dr. Estell Harpin who agrees with the above plan for discharge.       Cheri Fowler, PA-C 04/10/16 6045  Bethann Berkshire, MD 04/12/16 [phone]

## 2016-04-10 NOTE — ED Notes (Signed)
Family at bedside. 

## 2016-04-21 ENCOUNTER — Inpatient Hospital Stay (HOSPITAL_COMMUNITY): Payer: Medicare (Managed Care)

## 2016-04-21 ENCOUNTER — Inpatient Hospital Stay (HOSPITAL_COMMUNITY)
Admission: EM | Admit: 2016-04-21 | Discharge: 2016-04-25 | DRG: 871 | Disposition: A | Discharge status: Hospice | Payer: Medicare (Managed Care) | Attending: Internal Medicine | Admitting: Internal Medicine

## 2016-04-21 ENCOUNTER — Emergency Department (HOSPITAL_COMMUNITY): Payer: Medicare (Managed Care)

## 2016-04-21 ENCOUNTER — Encounter (HOSPITAL_COMMUNITY): Payer: Self-pay | Admitting: Physical Medicine and Rehabilitation

## 2016-04-21 DIAGNOSIS — G214 Vascular parkinsonism: Secondary | ICD-10-CM

## 2016-04-21 DIAGNOSIS — Z66 Do not resuscitate: Secondary | ICD-10-CM

## 2016-04-21 DIAGNOSIS — E870 Hyperosmolality and hypernatremia: Secondary | ICD-10-CM | POA: Diagnosis present

## 2016-04-21 DIAGNOSIS — Z515 Encounter for palliative care: Secondary | ICD-10-CM | POA: Diagnosis not present

## 2016-04-21 DIAGNOSIS — Z833 Family history of diabetes mellitus: Secondary | ICD-10-CM

## 2016-04-21 DIAGNOSIS — K117 Disturbances of salivary secretion: Secondary | ICD-10-CM | POA: Diagnosis not present

## 2016-04-21 DIAGNOSIS — J690 Pneumonitis due to inhalation of food and vomit: Secondary | ICD-10-CM | POA: Diagnosis present

## 2016-04-21 DIAGNOSIS — Z87891 Personal history of nicotine dependence: Secondary | ICD-10-CM | POA: Diagnosis not present

## 2016-04-21 DIAGNOSIS — B9620 Unspecified Escherichia coli [E. coli] as the cause of diseases classified elsewhere: Secondary | ICD-10-CM | POA: Diagnosis not present

## 2016-04-21 DIAGNOSIS — G20 Parkinson's disease: Secondary | ICD-10-CM | POA: Diagnosis present

## 2016-04-21 DIAGNOSIS — E785 Hyperlipidemia, unspecified: Secondary | ICD-10-CM | POA: Diagnosis present

## 2016-04-21 DIAGNOSIS — Z7189 Other specified counseling: Secondary | ICD-10-CM | POA: Insufficient documentation

## 2016-04-21 DIAGNOSIS — J9601 Acute respiratory failure with hypoxia: Secondary | ICD-10-CM | POA: Diagnosis present

## 2016-04-21 DIAGNOSIS — E872 Acidosis: Secondary | ICD-10-CM | POA: Diagnosis present

## 2016-04-21 DIAGNOSIS — F0280 Dementia in other diseases classified elsewhere without behavioral disturbance: Secondary | ICD-10-CM | POA: Diagnosis present

## 2016-04-21 DIAGNOSIS — J9600 Acute respiratory failure, unspecified whether with hypoxia or hypercapnia: Secondary | ICD-10-CM | POA: Diagnosis not present

## 2016-04-21 DIAGNOSIS — Z79899 Other long term (current) drug therapy: Secondary | ICD-10-CM

## 2016-04-21 DIAGNOSIS — R1310 Dysphagia, unspecified: Secondary | ICD-10-CM | POA: Diagnosis not present

## 2016-04-21 DIAGNOSIS — E876 Hypokalemia: Secondary | ICD-10-CM | POA: Diagnosis present

## 2016-04-21 DIAGNOSIS — F0150 Vascular dementia without behavioral disturbance: Secondary | ICD-10-CM | POA: Diagnosis present

## 2016-04-21 DIAGNOSIS — F4310 Post-traumatic stress disorder, unspecified: Secondary | ICD-10-CM | POA: Diagnosis present

## 2016-04-21 DIAGNOSIS — A419 Sepsis, unspecified organism: Secondary | ICD-10-CM

## 2016-04-21 DIAGNOSIS — I69351 Hemiplegia and hemiparesis following cerebral infarction affecting right dominant side: Secondary | ICD-10-CM

## 2016-04-21 DIAGNOSIS — Z7982 Long term (current) use of aspirin: Secondary | ICD-10-CM

## 2016-04-21 DIAGNOSIS — Z6821 Body mass index (BMI) 21.0-21.9, adult: Secondary | ICD-10-CM

## 2016-04-21 DIAGNOSIS — E46 Unspecified protein-calorie malnutrition: Secondary | ICD-10-CM | POA: Diagnosis present

## 2016-04-21 DIAGNOSIS — A4151 Sepsis due to Escherichia coli [E. coli]: Secondary | ICD-10-CM | POA: Diagnosis present

## 2016-04-21 DIAGNOSIS — N390 Urinary tract infection, site not specified: Secondary | ICD-10-CM | POA: Diagnosis present

## 2016-04-21 DIAGNOSIS — J9602 Acute respiratory failure with hypercapnia: Secondary | ICD-10-CM | POA: Diagnosis present

## 2016-04-21 DIAGNOSIS — R8271 Bacteriuria: Secondary | ICD-10-CM | POA: Diagnosis not present

## 2016-04-21 DIAGNOSIS — J189 Pneumonia, unspecified organism: Secondary | ICD-10-CM

## 2016-04-21 DIAGNOSIS — R0902 Hypoxemia: Secondary | ICD-10-CM

## 2016-04-21 LAB — CBC WITH DIFFERENTIAL/PLATELET
Basophils Absolute: 0 10*3/uL (ref 0.0–0.1)
Basophils Relative: 0 %
Eosinophils Absolute: 0.1 10*3/uL (ref 0.0–0.7)
Eosinophils Relative: 1 %
HCT: 41.8 % (ref 39.0–52.0)
Hemoglobin: 13 g/dL (ref 13.0–17.0)
Lymphocytes Relative: 22 %
Lymphs Abs: 3.9 10*3/uL (ref 0.7–4.0)
MCH: 28.5 pg (ref 26.0–34.0)
MCHC: 31.1 g/dL (ref 30.0–36.0)
MCV: 91.7 fL (ref 78.0–100.0)
Monocytes Absolute: 1.2 10*3/uL — ABNORMAL HIGH (ref 0.1–1.0)
Monocytes Relative: 6 %
Neutro Abs: 12.7 10*3/uL — ABNORMAL HIGH (ref 1.7–7.7)
Neutrophils Relative %: 71 %
Platelets: 198 10*3/uL (ref 150–400)
RBC: 4.56 MIL/uL (ref 4.22–5.81)
RDW: 16.5 % — ABNORMAL HIGH (ref 11.5–15.5)
WBC: 17.9 10*3/uL — ABNORMAL HIGH (ref 4.0–10.5)

## 2016-04-21 LAB — BASIC METABOLIC PANEL
ANION GAP: 6 (ref 5–15)
Anion gap: 7 (ref 5–15)
Anion gap: 6 (ref 5–15)
Anion gap: 6 (ref 5–15)
BUN: 20 mg/dL (ref 6–20)
BUN: 19 mg/dL (ref 6–20)
BUN: 16 mg/dL (ref 6–20)
BUN: 14 mg/dL (ref 6–20)
CALCIUM: 7.7 mg/dL — AB (ref 8.9–10.3)
CALCIUM: 8.4 mg/dL — AB (ref 8.9–10.3)
CHLORIDE: 127 mmol/L — AB (ref 101–111)
CO2: 21 mmol/L — ABNORMAL LOW (ref 22–32)
CO2: 23 mmol/L (ref 22–32)
CO2: 13 mmol/L — AB (ref 22–32)
CO2: 22 mmol/L (ref 22–32)
CREATININE: 0.85 mg/dL (ref 0.61–1.24)
CREATININE: 0.91 mg/dL (ref 0.61–1.24)
Calcium: 8.9 mg/dL (ref 8.9–10.3)
Calcium: 8.3 mg/dL — ABNORMAL LOW (ref 8.9–10.3)
Chloride: 125 mmol/L — ABNORMAL HIGH (ref 101–111)
Chloride: 128 mmol/L — ABNORMAL HIGH (ref 101–111)
Chloride: 127 mmol/L — ABNORMAL HIGH (ref 101–111)
Creatinine, Ser: 1.16 mg/dL (ref 0.61–1.24)
Creatinine, Ser: 1.08 mg/dL (ref 0.61–1.24)
GFR calc Af Amer: >60 mL/min (ref >60)
GFR calc Af Amer: >60 mL/min (ref >60)
GFR calc Af Amer: >60 mL/min (ref >60)
GFR calc non Af Amer: >60 mL/min (ref >60)
GFR calc non Af Amer: >60 mL/min (ref >60)
GFR calc non Af Amer: >60 mL/min (ref >60)
GLUCOSE: 131 mg/dL — AB (ref 65–99)
Glucose, Bld: 118 mg/dL — ABNORMAL HIGH (ref 65–99)
Glucose, Bld: 126 mg/dL — ABNORMAL HIGH (ref 65–99)
Glucose, Bld: 143 mg/dL — ABNORMAL HIGH (ref 65–99)
POTASSIUM: 2.5 mmol/L — AB (ref 3.5–5.1)
Potassium: 5.6 mmol/L — ABNORMAL HIGH (ref 3.5–5.1)
Potassium: >7.5 mmol/L (ref 3.5–5.1)
Potassium: 2.8 mmol/L — ABNORMAL LOW (ref 3.5–5.1)
SODIUM: 157 mmol/L — AB (ref 135–145)
SODIUM: 155 mmol/L — AB (ref 135–145)
Sodium: 153 mmol/L — ABNORMAL HIGH (ref 135–145)
Sodium: 146 mmol/L — ABNORMAL HIGH (ref 135–145)

## 2016-04-21 LAB — URINALYSIS, ROUTINE W REFLEX MICROSCOPIC
Bilirubin Urine: NEGATIVE
Glucose, UA: NEGATIVE mg/dL
Ketones, ur: NEGATIVE mg/dL
Nitrite: NEGATIVE
Protein, ur: NEGATIVE mg/dL
Specific Gravity, Urine: 1.014 (ref 1.005–1.030)
pH: 6 (ref 5.0–8.0)

## 2016-04-21 LAB — I-STAT ARTERIAL BLOOD GAS, ED
Acid-base deficit: 3 mmol/L — ABNORMAL HIGH (ref 0.0–2.0)
Acid-base deficit: 5 mmol/L — ABNORMAL HIGH (ref 0.0–2.0)
Bicarbonate: 24.3 mEq/L — ABNORMAL HIGH (ref 20.0–24.0)
Bicarbonate: 20.7 mEq/L (ref 20.0–24.0)
O2 Saturation: 88 %
O2 Saturation: 98 %
TCO2: 26 mmol/L (ref 0–100)
TCO2: 22 mmol/L (ref 0–100)
pCO2 arterial: 54 mmHg — ABNORMAL HIGH (ref 35.0–45.0)
pCO2 arterial: 39.7 mmHg (ref 35.0–45.0)
pH, Arterial: 7.261 — ABNORMAL LOW (ref 7.350–7.450)
pH, Arterial: 7.326 — ABNORMAL LOW (ref 7.350–7.450)
pO2, Arterial: 64 mmHg — ABNORMAL LOW (ref 80.0–100.0)
pO2, Arterial: 110 mmHg — ABNORMAL HIGH (ref 80.0–100.0)

## 2016-04-21 LAB — LACTIC ACID, PLASMA: Lactic Acid, Venous: 2.3 mmol/L (ref 0.5–2.0)

## 2016-04-21 LAB — URINE MICROSCOPIC-ADD ON

## 2016-04-21 LAB — I-STAT CG4 LACTIC ACID, ED: LACTIC ACID, VENOUS: 2.04 mmol/L — AB (ref 0.5–2.0)

## 2016-04-21 LAB — D-DIMER, QUANTITATIVE: D-Dimer, Quant: 17.14 ug/mL-FEU — ABNORMAL HIGH (ref 0.00–0.50)

## 2016-04-21 LAB — MRSA PCR SCREENING: MRSA by PCR: NEGATIVE

## 2016-04-21 MED ORDER — POTASSIUM CHLORIDE 10 MEQ/100ML IV SOLN
10.0000 meq | INTRAVENOUS | Status: DC
  Administered 2016-04-21 (×3): 10 meq via INTRAVENOUS
  Filled 2016-04-21 (×4): qty 100

## 2016-04-21 MED ORDER — PIPERACILLIN-TAZOBACTAM 3.375 G IVPB
3.3750 g | Freq: Three times a day (TID) | INTRAVENOUS | Status: DC
  Administered 2016-04-21 – 2016-04-22 (×2): 3.375 g via INTRAVENOUS
  Filled 2016-04-21 (×4): qty 50

## 2016-04-21 MED ORDER — LACTATED RINGERS IV BOLUS (SEPSIS)
1000.0000 mL | Freq: Once | INTRAVENOUS | Status: AC
  Administered 2016-04-21: 1000 mL via INTRAVENOUS

## 2016-04-21 MED ORDER — PIPERACILLIN-TAZOBACTAM 3.375 G IVPB 30 MIN
3.3750 g | Freq: Once | INTRAVENOUS | Status: AC
  Administered 2016-04-21: 3.375 g via INTRAVENOUS
  Filled 2016-04-21: qty 50

## 2016-04-21 MED ORDER — SODIUM CHLORIDE 0.9 % IV SOLN: INTRAVENOUS | Status: DC

## 2016-04-21 MED ORDER — IOPAMIDOL (ISOVUE-370) INJECTION 76%
INTRAVENOUS | Status: AC
  Administered 2016-04-21: 100 mL
  Filled 2016-04-21: qty 100

## 2016-04-21 MED ORDER — DEXTROSE 5 % AND 0.45 % NACL IV BOLUS: 1000.0000 mL | Freq: Once | INTRAVENOUS | Status: DC

## 2016-04-21 MED ORDER — POTASSIUM CHLORIDE 10 MEQ/100ML IV SOLN
10.0000 meq | INTRAVENOUS | Status: AC
  Administered 2016-04-21 – 2016-04-22 (×5): 10 meq via INTRAVENOUS
  Filled 2016-04-21 (×5): qty 100

## 2016-04-21 MED ORDER — ACETAMINOPHEN 325 MG PO TABS: 650.0000 mg | ORAL_TABLET | Freq: Four times a day (QID) | ORAL | Status: DC | PRN

## 2016-04-21 MED ORDER — VANCOMYCIN HCL 10 G IV SOLR
1500.0000 mg | Freq: Once | INTRAVENOUS | Status: AC
  Administered 2016-04-21: 1500 mg via INTRAVENOUS
  Filled 2016-04-21: qty 1500

## 2016-04-21 MED ORDER — SODIUM CHLORIDE 0.9 % IV BOLUS (SEPSIS): 1000.0000 mL | Freq: Once | INTRAVENOUS | Status: DC

## 2016-04-21 MED ORDER — SODIUM CHLORIDE 0.9 % IV BOLUS (SEPSIS)
2000.0000 mL | Freq: Once | INTRAVENOUS | Status: AC
  Administered 2016-04-21: 2000 mL via INTRAVENOUS

## 2016-04-21 MED ORDER — DEXTROSE-NACL 5-0.45 % IV SOLN
INTRAVENOUS | Status: DC
  Administered 2016-04-21: 14:00:00 via INTRAVENOUS

## 2016-04-21 MED ORDER — ENOXAPARIN SODIUM 40 MG/0.4ML ~~LOC~~ SOLN
40.0000 mg | SUBCUTANEOUS | Status: DC
  Administered 2016-04-21 – 2016-04-24 (×4): 40 mg via SUBCUTANEOUS
  Filled 2016-04-21 (×4): qty 0.4

## 2016-04-21 MED ORDER — ACETAMINOPHEN 650 MG RE SUPP
650.0000 mg | Freq: Four times a day (QID) | RECTAL | Status: DC | PRN
Start: 2016-04-21 — End: 2016-04-25

## 2016-04-21 MED ORDER — SODIUM CHLORIDE 0.9 % IV SOLN
500.0000 mg | Freq: Two times a day (BID) | INTRAVENOUS | Status: DC
  Administered 2016-04-21: 500 mg via INTRAVENOUS
  Filled 2016-04-21 (×3): qty 500

## 2016-04-21 NOTE — H&P (Signed)
Date: 04/21/2016               Patient Name:  Anthony Heath MRN: [id number]  DOB: 1946/03/09 Age / Sex: 70 y.o., male   PCP: Reuben Likes, MD         Medical Service: Internal Medicine Teaching Service         Attending Physician: Dr. Inez Catalina, MD    First Contact: Dr. Ruben Im, MD Pager: [phone]  Second Contact: Dr. 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 The patient had difficulty communicating (baseline), but his sister, Anthony Heath) was at bedside to relay the history. The patient lives with his other sister, Anthony Heath, also an HCPOA, who was not present at the time.  Anthony Heath says Anthony Heath "never complains of anything," but did notice that he started to have difficulty swallowing three days ago, and therefore, has had minimal liquid intake. He typically can only eat food if it is pureed, but now he has been having difficulty managing soft foods. They've also noted that his urine smelled of "ammonia," but did not appear cloudy. The sister Anthony Heath had noted that he appeared "feverish." They've also noticed that he had had more difficulty breathing and has been having a "rattling cough."He continued to worsen, so they sought medical attention. To their knowledge, he has not had any chest pain, dysuria, worsening confusion, new one-sided weakness.   He was seen in the ED on 5/30 for a productive cough without fever. There was concern for aspiration at that time, but CXR showed only atelectasis on left mid-lung.  The sister reports that the patient is minimally conversant at home but may have a "back and  forth" with the sisters as he watches TV. He apparently used to watch sports, but now will watch whatever is one. He is non-ambulatory. After his stroke, he went to rehab for a time, but he continued to worsen. Looking back at his discharge summary from 2011 when he had his CVA, it does not seem he had the left-sided tremor at that time. He was on a nectar-consistency diet. Anthony Heath indicates that he has never been on L-DOPA or drugs to treat parkinsonism. At baseline, he is oriented to self and place, but not the year. They've noted a steady decline in his quality of life since 2011. He receives all his care from the [facility] and does not apparently follow with a neurologist. He is a former smoker with a previous 30 pack-year history. The sister says there is a family history T2DM and cancer.  Sister Anthony Heath and I spoke extensively about Code Status, she believe it would be his wishes to forego intubation, chest compressions, and cardioversion. She is supportive of this plan.   In the ED, he was noted to have large leuks, negative nitrites on UA. He was started on vancomycin and zosyn. Urine cultures were not obtained, but blood cultures were obtained. WBCs to 17.9 (NAb 12.7) with T98.1. Due to his increased work of breathing, an ABG was obtained which demonstrated  pO2 64; pCO2 54; pH 7.261;  HCO3  24.3, %O2 Sat 88. He was placed on BiPAP thereafter. He was noted to be hypernatremic to 153 and hypokalemic to 5.6, with EKG showing sinus tachycardia without peaked T waves. He was bolused 2L NS. AP CXR Chronic lung changes without evidence of superimposed acute cardiopulmonary disease.   Meds: Current Facility-Administered Medications  Medication Dose Route Frequency Provider Last Rate Last Dose  . acetaminophen (TYLENOL) tablet 650 mg  650 mg Oral Q6H PRN Denton Brickiana M Truong, MD       Or  . acetaminophen (TYLENOL) suppository 650 mg  650 mg Rectal Q6H PRN Denton Brickiana M Truong, MD      . dextrose 5 %-0.45 %  sodium chloride infusion   Intravenous Continuous Denton Brickiana M Truong, MD 75 mL/hr at 04/21/16 1400    . piperacillin-tazobactam (ZOSYN) IVPB 3.375 g  3.375 g Intravenous Q8H Candis SchatzBenjamin G Mancheril, RPH      . potassium chloride 10 mEq in 100 mL IVPB  10 mEq Intravenous Q1 Hr x 4 Denton Brickiana M Truong, MD 100 mL/hr at 04/21/16 1357 10 mEq at 04/21/16 1357  . vancomycin (VANCOCIN) 500 mg in sodium chloride 0.9 % 100 mL IVPB  500 mg Intravenous Q12H Sampson SiBenjamin G Mancheril, [facility]       Current Outpatient Prescriptions  Medication Sig Dispense Refill  . acetaminophen (TYLENOL) 325 MG tablet Take 650 mg by mouth every 6 (six) hours as needed for mild pain or fever.    Marland Kitchen. aspirin EC 81 MG tablet Take 81 mg by mouth every morning.    Marland Kitchen. atorvastatin (LIPITOR) 80 MG tablet Take 40 mg by mouth daily.     Marland Kitchen. docusate sodium (COLACE) 100 MG capsule Take 200 mg by mouth daily.    Marland Kitchen. latanoprost (XALATAN) 0.005 % ophthalmic solution Place 1 drop into both eyes at bedtime.    Marland Kitchen. omeprazole (PRILOSEC) 20 MG capsule Take 40 mg by mouth daily.     Marland Kitchen. senna (SENOKOT) 8.6 MG TABS tablet Take 1-2 tablets by mouth daily as needed for mild constipation.    . polyvinyl alcohol (LIQUIFILM TEARS) 1.4 % ophthalmic solution Place 1-2 drops into both eyes daily as needed for dry eyes.      Allergies: Allergies as of 04/21/2016  . (No Known Allergies)   Past Medical History  Diagnosis Date  . Stroke [facility](HCC)    History reviewed. No pertinent past surgical history. No family history on file. Social History   Social History  . Marital Status: Divorced    Spouse Name: N/A  . Number of Children: N/A  . Years of Education: N/A   Occupational History  . Not on file.   Social History Main Topics  . Smoking status: Never Smoker   . Smokeless tobacco: Not on file  . Alcohol Use: No  . Drug Use: No  . Sexual Activity: Not on file   Other Topics Concern  . Not on file   Social History Narrative    Review of Systems: Could not  obtain full review of systems given that patient was minimally conversant and only intermittently responding to questions.  Physical Exam: Blood pressure 111/80, pulse 117, temperature 99.2 F (37.3 C), temperature source Rectal, resp. rate 28, SpO2 99 %. General: Cachectic appearing man, lying in bed, BiPAP in place and briefly removed for orientation questions and oral exam HEENT: Dry mucous membranes,  Cardiovascular: Tachycardic, regular. No murmurs, rubs, or gallops. Pulmonary: Rhonchi in the bases. No wheezes. Did not appear to be in respiratory distress  with BiPAP temporarily removed Abdominal: Soft NT/ND. Hypoactive bowel sounds. No apparent guarding or suprapubic tenderness. No apparent CVA tenderness. Extremities: No clubbing, cyanosis, or edema Skin: Warm and dry. No rashes. Neurological: Minimally conversant, oriented to person (whispers softly), place with choices, not year. Right sided hemiparesis, left sided rhythmic, resting tremor in upper and lower extremities, masked facies.  Lab results: Basic Metabolic Panel:  Recent Labs  96/04/54 1055 04/21/16 1237  NA 153* 157*  K 5.6* 2.5*  CL 125* 128*  CO2 21* 23  GLUCOSE 118* 126*  BUN 20 19  CREATININE 1.16 1.08  CALCIUM 8.9 8.3*   CBC:  Recent Labs  04/21/16 1055  WBC 17.9*  NEUTROABS 12.7*  HGB 13.0  HCT 41.8  MCV 91.7  PLT 198  Urinalysis:  Recent Labs  04/21/16 1019  COLORURINE YELLOW  LABSPEC 1.014  PHURINE 6.0  GLUCOSEU NEGATIVE  HGBUR MODERATE*  BILIRUBINUR NEGATIVE  KETONESUR NEGATIVE  PROTEINUR NEGATIVE  NITRITE NEGATIVE  LEUKOCYTESUR LARGE*      Ref Range 1:06 PM  10:55 AM  20yr ago      pH, Arterial 7.350 - 7.450  7.326 (L) 7.261 (L)     pCO2 arterial 35.0 - 45.0 mmHg 39.7 54.0 (H)     pO2, Arterial 80.0 - 100.0 mmHg 110.0 (H) 64.0 (L)     Bicarbonate 20.0 - 24.0 mEq/L 20.7 24.3 (H)     TCO2 0 - 100 mmol/L 22 26 28     O2 Saturation % 98.0 88.0     Acid-base deficit 0.0  - 2.0 mmol/L 5.0 (H) 3.0 (H)     Patient temperature  HIDE HIDE     Sample type  ARTERIAL ARTERIAL          Imaging results:  Ct Head Wo Contrast  04/21/2016  CLINICAL DATA:  70 year old male with a history of new right-sided deficits EXAM: CT HEAD WITHOUT CONTRAST TECHNIQUE: Contiguous axial images were obtained from the base of the skull through the vertex without intravenous contrast. COMPARISON:  07/19/2014 FINDINGS: Unremarkable appearance of the calvarium without acute fracture or aggressive lesion. Unremarkable appearance of the scalp soft tissues. Unremarkable appearance of the bilateral orbits. Debris within the bilateral external auditory canal. Mastoid air cells are clear. No significant paranasal sinus disease No acute intracranial hemorrhage.  No midline shift or mass effect. Similar appearance of diffuse brain volume loss and expansion of the ventricles. Confluent hypodensity in the bilateral periventricular white matter is unchanged. Intracranial atherosclerosis. IMPRESSION: No CT evidence of acute intracranial abnormality. Similar appearance of chronic white matter disease and intracranial atherosclerosis. Brain volume loss, similar to prior. Signed, Yvone Neu. Loreta Ave, DO [facility] Electronically Signed   By: Gilmer Mor D.O.   On: 04/21/2016 13:44   Dg Chest Portable 1 View  04/21/2016  CLINICAL DATA:  70 year old male with a history of altered mental status EXAM: PORTABLE CHEST 1 VIEW COMPARISON:  04/10/2016 FINDINGS: Right rotation. Cardiomediastinal silhouette unchanged. No confluent airspace disease pneumothorax or pleural effusion. Coarsened interstitial markings similar to prior. No displaced fracture. IMPRESSION: Chronic lung changes without evidence of superimposed acute cardiopulmonary disease. Signed, Yvone Neu. Loreta Ave, DO [facility] Electronically Signed    By: Gilmer Mor D.O.   On: 04/21/2016 11:07    Other results: Sinus tachycardia. Possible lateral old infarct.  Assessment & Plan by Problem:  Sepsis: Most likely sources are either an aspiration pneumonia not seen yet on CXR  or from UTI. With respect to aspiration, patient is noted to have vascular parkinsonism which may be advancing, increasing risk of aspiration. It has been noted he's been having a more difficult time swallowing. For UTI, patient noted to be growing enterococcus on urine culture from 2011. We will treat with broad spectrum antibiotics, covering anaerobes. We have added a urine culture. Patient has a Wells PE score of 3, given immobility, and we will obtain d-dimer. Lactate 2.04. He is not longer hypotensive after fluid boluses. - Fluids as below - Vancomycin and pip/tazo per pharm - Repeat CXR in AM - D-dimer pending - NPO - BCx, UCx pending - Admit to SDU  Acute Respiratory Failure: ABG demonstrated acute respiratory acidosis, improving pH on BiPAP. Possible causes include aspiration versus PE, although hypercapnia is rare in the latter. Early ARDS 2/2 sepsis is also a possibility, although CXR is reassuring. Patient has not respiratory issues prior to this episode. - BiPAP prn - D-Dimer pending  Vascular Parkinsonism: Severe resting tremor on exam. May be leading to increased risk of aspiration. It is unclear at this time why L-DOPA or dopaminergic agents have not been attempted. - Consider intiating L-DOPA, neurology referral on discharge  Hypernatremia: Na increased from 153 to 157 after 2LNS. Will initiated D5 1/2NS with a goal of 8 mEq per 24 hour period. Likely due to poor PO intake. - D5 1/2NS at 75 cc/hr - BMET at 5:30 PM  Hyper/Hypokalemia: K initially 5.6 w/o EKG changes. Now 2.5 likely as acidosis is improving. - 4 x 10 mEq KCL IV - BMET at 5:30  Right Hemiparesis: From previous CVA.  - Pillow between legs to prevent ulcers  DVT PPx: Lovenox  [place]  Code Status: DNR, see discussion with sister in HPI  Dispo: Disposition is deferred at this time, awaiting improvement of current medical problems. Anticipated discharge in approximately 4-5 day(s).   The patient does have a current PCP Reuben Likes, MD) and does not need an [facility] hospital follow-up appointment after discharge.  The patient does have transportation limitations that hinder transportation to clinic appointments.  Signed: Ruben Im, MD 04/21/2016, 2:25 PM

## 2016-04-21 NOTE — ED Provider Notes (Signed)
CSN: [id number]     Arrival date & time 04/21/16  4098 History   First MD Initiated Contact with Patient 04/21/16 1017     Chief Complaint  Patient presents with  . Altered Mental Status     (Consider location/radiation/quality/duration/timing/severity/associated sxs/prior Treatment) HPI   70 year old male with a past history of CVA with chronic right-sided deficits presenting from home for evaluation of altered mental status. Patient is currently nonverbal and history at this point is essentially just from prior records. He arrived tachycardic, tachypnea and hypoxic. Bilateral rhonchi. Records show prior history of aspiration. No reported trauma.  Past Medical History  Diagnosis Date  . Stroke [facility])    History reviewed. No pertinent past surgical history. No family history on file. Social History  Substance Use Topics  . Smoking status: Never Smoker   . Smokeless tobacco: None  . Alcohol Use: No    Review of Systems  Level V caveat this patient is nonverbal.   Allergies  Review of patient's allergies indicates no known allergies.  Home Medications   Prior to Admission medications   Medication Sig Start Date End Date Taking? Authorizing Provider  acetaminophen (TYLENOL) 325 MG tablet Take 650 mg by mouth every 6 (six) hours as needed for mild pain or fever.    Historical Provider, MD  aspirin EC 81 MG tablet Take 81 mg by mouth every morning.    Historical Provider, MD  atorvastatin (LIPITOR) 80 MG tablet Take 80 mg by mouth daily.  04/20/14   Historical Provider, MD  latanoprost (XALATAN) 0.005 % ophthalmic solution Place 1 drop into both eyes at bedtime. 04/06/16   Historical Provider, MD  omeprazole (PRILOSEC) 20 MG capsule Take 20 mg by mouth daily.  06/11/14   Historical Provider, MD  polyvinyl alcohol (LIQUIFILM TEARS) 1.4 % ophthalmic solution Place 1-2 drops into both eyes daily as needed for dry eyes.    Historical Provider, MD  senna (SENOKOT) 8.6 MG TABS tablet Take  1-2 tablets by mouth daily as needed for mild constipation.    Historical Provider, MD   BP 136/89 mmHg  Pulse 130  Temp(Src) 99.2 F (37.3 C) (Rectal)  Resp 28  SpO2 92% Physical Exam  Constitutional: He appears distressed.  Cachectic and chronically ill-appearing. distressed.  HENT:  Head: Normocephalic.  Eyes: Conjunctivae are normal. Pupils are equal, round, and reactive to light. Right eye exhibits no discharge. Left eye exhibits no discharge.  Neck: Neck supple.  Cardiovascular: Regular rhythm and normal heart sounds.  Exam reveals no gallop and no friction rub.   No murmur heard. Tachycardic  Pulmonary/Chest: He is in respiratory distress.  Tachypnea. Accessory muscle usage. Bilateral rhonchi.  Abdominal: Soft. He exhibits no distension. There is no tenderness.  Palpable, but non-aneurysmal abdominal aorta  Musculoskeletal: He exhibits no edema or tenderness.  Neurological:  Living with eyes closed. Will open them to painful stimuli. Please to threat. No obvious facial droop. He is not responding to questioning or following commands. He has some shaking of his right upper extremity but tone seems normal. Tone of left lower extremity is increased. No inducible clonus.  Skin: Skin is warm and dry.  Nursing note and vitals reviewed.   ED Course  Procedures (including critical care time)  CRITICAL CARE Performed by: Raeford Razor Total critical care time: 35 minutes Critical care time was exclusive of separately billable procedures and treating other patients. Critical care was necessary to treat or prevent imminent or life-threatening deterioration. Critical care was  time spent personally by me on the following activities: development of treatment plan with patient and/or surrogate as well as nursing, discussions with consultants, evaluation of patient's response to treatment, examination of patient, obtaining history from patient or surrogate, ordering and performing  treatments and interventions, ordering and review of laboratory studies, ordering and review of radiographic studies, pulse oximetry and re-evaluation of patient's condition.  Labs Review Labs Reviewed  CBC WITH DIFFERENTIAL/PLATELET - Abnormal; Notable for the following:    WBC 17.9 (*)    RDW 16.5 (*)    Neutro Abs 12.7 (*)    Monocytes Absolute 1.2 (*)    All other components within normal limits  BASIC METABOLIC PANEL - Abnormal; Notable for the following:    Sodium 153 (*)    Potassium 5.6 (*)    Chloride 125 (*)    CO2 21 (*)    Glucose, Bld 118 (*)    All other components within normal limits  URINALYSIS, ROUTINE W REFLEX MICROSCOPIC (NOT [facility]) - Abnormal; Notable for the following:    APPearance CLOUDY (*)    Hgb urine dipstick MODERATE (*)    Leukocytes, UA LARGE (*)    All other components within normal limits  URINE MICROSCOPIC-ADD ON - Abnormal; Notable for the following:    Squamous Epithelial / LPF 0-5 (*)    Bacteria, UA MANY (*)    All other components within normal limits  I-STAT CG4 LACTIC ACID, ED - Abnormal; Notable for the following:    Lactic Acid, Venous 2.04 (*)    All other components within normal limits  I-STAT ARTERIAL BLOOD GAS, ED - Abnormal; Notable for the following:    pH, Arterial 7.261 (*)    pCO2 arterial 54.0 (*)    pO2, Arterial 64.0 (*)    Bicarbonate 24.3 (*)    Acid-base deficit 3.0 (*)    All other components within normal limits  CULTURE, BLOOD (ROUTINE X 2)  CULTURE, BLOOD (ROUTINE X 2)  BASIC METABOLIC PANEL    Imaging Review Dg Chest Portable 1 View  04/21/2016  CLINICAL DATA:  70 year old male with a history of altered mental status EXAM: PORTABLE CHEST 1 VIEW COMPARISON:  04/10/2016 FINDINGS: Right rotation. Cardiomediastinal silhouette unchanged. No confluent airspace disease pneumothorax or pleural effusion. Coarsened interstitial markings similar to prior. No displaced fracture. IMPRESSION: Chronic lung changes without  evidence of superimposed acute cardiopulmonary disease. Signed, Yvone NeuJaime S. Loreta AveWagner, DO [facility] Electronically Signed   By: Gilmer MorJaime  Wagner D.O.   On: 04/21/2016 11:07   I have personally reviewed and evaluated these images and lab results as part of my medical decision-making.   EKG Interpretation None      MDM   Final diagnoses:  Sepsis, due to unspecified organism [facility](HCC)  UTI (lower urinary tract infection)  Acute respiratory failure with hypoxia and hypercapnia (HCC)    70 year old male with altered mental status. Clinically suspect sepsis at this point. He has a history of aspiration. He is hypoxic and has rhonchi. Empiric antibodies were ordered. IV fluids. Chest x-ray, labs, urinalysis. Remains with increased work of breathing and hypoxemia despite a nonrebreather. Will check an ABG. We'll try BiPAP. Family is reportedly in route to the hospital. Unclear of his CODE STATUS at this point.   Raeford RazorStephen Jep Dyas, MD 04/21/16 1224

## 2016-04-21 NOTE — Progress Notes (Signed)
Pt back from CT, uneventful trip. No distress noted. Pt still on NIV tolerating it well.

## 2016-04-21 NOTE — Progress Notes (Signed)
Pt arrived to unit at 1545 on continuous bipap. CHG bath completed and MRSA PCA screen completed. Pt unable to provide assessment interview for admission completion at this time. Will continue to monitor.

## 2016-04-21 NOTE — ED Notes (Signed)
Pt had a small BM. Pt cleaned and new linen placed

## 2016-04-21 NOTE — Progress Notes (Signed)
Pt was transferred to [facility] from Ed on Bipap with no complications noted.

## 2016-04-21 NOTE — Progress Notes (Signed)
Pharmacy Antibiotic Note  Dineen KidHoward Power is a 70 y.o. male admitted on 04/21/2016 with 1 day history of intermittent productive cough and altered mental status.  Pharmacy has been consulted for Vancomycin and Zosyn dosing for sepsis.   WBC 17.9, afebrile. LA 2.04. CrCl ~ 55 mL/min   Plan: -Zosyn 3.375 gm IV Q 8 hours -Vancomycin 1500 mg IV once followed by Vancomycin 500 mg IV Q 12 hours -Monitor CBC, renal fx, cultures and clinical progress -VT at SS      Temp (24hrs), Avg:99.2 F (37.3 C), Min:99.2 F (37.3 C), Max:99.2 F (37.3 C)  No results for input(s): WBC, CREATININE, LATICACIDVEN, VANCOTROUGH, VANCOPEAK, VANCORANDOM, GENTTROUGH, GENTPEAK, GENTRANDOM, TOBRATROUGH, TOBRAPEAK, TOBRARND, AMIKACINPEAK, AMIKACINTROU, AMIKACIN in the last 168 hours.  CrCl cannot be calculated (Patient has no serum creatinine result on file.).    No Known Allergies  Antimicrobials this admission: 6/10 Vanc>> 6/10 Zosyn x 1 dose   Dose adjustments this admission: None   Microbiology results: 6/10 BCx2>> 6/10 UCx>>  Thank you for allowing pharmacy to be a part of this patient's care.  Vinnie LevelBenjamin Matasha Smigelski, PharmD., BCPS Clinical Pharmacist Pager [phone]

## 2016-04-21 NOTE — ED Notes (Signed)
Report called. Waiting on resp

## 2016-04-22 ENCOUNTER — Inpatient Hospital Stay (HOSPITAL_COMMUNITY): Payer: Medicare (Managed Care)

## 2016-04-22 DIAGNOSIS — B9689 Other specified bacterial agents as the cause of diseases classified elsewhere: Secondary | ICD-10-CM

## 2016-04-22 DIAGNOSIS — E876 Hypokalemia: Secondary | ICD-10-CM

## 2016-04-22 DIAGNOSIS — J690 Pneumonitis due to inhalation of food and vomit: Secondary | ICD-10-CM

## 2016-04-22 DIAGNOSIS — N390 Urinary tract infection, site not specified: Secondary | ICD-10-CM

## 2016-04-22 LAB — BASIC METABOLIC PANEL
ANION GAP: 5 (ref 5–15)
ANION GAP: 7 (ref 5–15)
Anion gap: 6 (ref 5–15)
BUN: 10 mg/dL (ref 6–20)
BUN: 9 mg/dL (ref 6–20)
BUN: 5 mg/dL — ABNORMAL LOW (ref 6–20)
CALCIUM: 7.7 mg/dL — AB (ref 8.9–10.3)
CALCIUM: 7.4 mg/dL — AB (ref 8.9–10.3)
CHLORIDE: 126 mmol/L — AB (ref 101–111)
CO2: 22 mmol/L (ref 22–32)
CO2: 19 mmol/L — ABNORMAL LOW (ref 22–32)
CO2: 18 mmol/L — AB (ref 22–32)
CREATININE: 0.79 mg/dL (ref 0.61–1.24)
CREATININE: 0.75 mg/dL (ref 0.61–1.24)
CREATININE: 0.72 mg/dL (ref 0.61–1.24)
Calcium: 8.4 mg/dL — ABNORMAL LOW (ref 8.9–10.3)
Chloride: 126 mmol/L — ABNORMAL HIGH (ref 101–111)
Chloride: 125 mmol/L — ABNORMAL HIGH (ref 101–111)
GFR calc non Af Amer: >60 mL/min (ref >60)
GFR calc non Af Amer: >60 mL/min (ref >60)
GLUCOSE: 91 mg/dL (ref 65–99)
Glucose, Bld: 121 mg/dL — ABNORMAL HIGH (ref 65–99)
Glucose, Bld: 146 mg/dL — ABNORMAL HIGH (ref 65–99)
POTASSIUM: 2.9 mmol/L — AB (ref 3.5–5.1)
Potassium: 3.2 mmol/L — ABNORMAL LOW (ref 3.5–5.1)
Potassium: 3.1 mmol/L — ABNORMAL LOW (ref 3.5–5.1)
SODIUM: 154 mmol/L — AB (ref 135–145)
SODIUM: 150 mmol/L — AB (ref 135–145)
Sodium: 150 mmol/L — ABNORMAL HIGH (ref 135–145)

## 2016-04-22 LAB — CBC
HEMATOCRIT: 33.5 % — AB (ref 39.0–52.0)
HEMOGLOBIN: 10.2 g/dL — AB (ref 13.0–17.0)
MCH: 27.8 pg (ref 26.0–34.0)
MCHC: 30.4 g/dL (ref 30.0–36.0)
MCV: 91.3 fL (ref 78.0–100.0)
Platelets: 159 10*3/uL (ref 150–400)
RBC: 3.67 MIL/uL — ABNORMAL LOW (ref 4.22–5.81)
RDW: 16.4 % — AB (ref 11.5–15.5)
WBC: 11.3 10*3/uL — AB (ref 4.0–10.5)

## 2016-04-22 LAB — GLUCOSE, CAPILLARY
GLUCOSE-CAPILLARY: 98 mg/dL (ref 65–99)
GLUCOSE-CAPILLARY: 95 mg/dL (ref 65–99)
Glucose-Capillary: 274 mg/dL — ABNORMAL HIGH (ref 65–99)

## 2016-04-22 LAB — MAGNESIUM: MAGNESIUM: 1.9 mg/dL (ref 1.7–2.4)

## 2016-04-22 LAB — LACTIC ACID, PLASMA: LACTIC ACID, VENOUS: 1.2 mmol/L (ref 0.5–2.0)

## 2016-04-22 MED ORDER — SODIUM CHLORIDE 0.9% FLUSH
10.0000 mL | Freq: Two times a day (BID) | INTRAVENOUS | Status: DC
  Administered 2016-04-22 – 2016-04-23 (×2): 10 mL
  Administered 2016-04-23: 20 mL
  Administered 2016-04-24 – 2016-04-25 (×2): 10 mL

## 2016-04-22 MED ORDER — SODIUM CHLORIDE 0.9 % IV BOLUS (SEPSIS)
2000.0000 mL | Freq: Once | INTRAVENOUS | Status: AC
  Administered 2016-04-22: 2000 mL via INTRAVENOUS

## 2016-04-22 MED ORDER — SODIUM CHLORIDE 0.9 % IV SOLN
1.5000 g | Freq: Four times a day (QID) | INTRAVENOUS | Status: DC
  Administered 2016-04-22 – 2016-04-25 (×12): 1.5 g via INTRAVENOUS
  Filled 2016-04-22 (×16): qty 1.5

## 2016-04-22 MED ORDER — POTASSIUM CHLORIDE 10 MEQ/100ML IV SOLN
10.0000 meq | INTRAVENOUS | Status: AC
  Administered 2016-04-22 (×3): 10 meq via INTRAVENOUS
  Filled 2016-04-22 (×3): qty 100

## 2016-04-22 MED ORDER — KCL IN DEXTROSE-NACL 40-5-0.45 MEQ/L-%-% IV SOLN
INTRAVENOUS | Status: AC
  Administered 2016-04-23: 01:00:00 via INTRAVENOUS
  Filled 2016-04-22: qty 1000

## 2016-04-22 MED ORDER — POTASSIUM CHLORIDE 10 MEQ/100ML IV SOLN: 10.0000 meq | INTRAVENOUS | Status: DC

## 2016-04-22 MED ORDER — POTASSIUM CHLORIDE 10 MEQ/100ML IV SOLN
10.0000 meq | INTRAVENOUS | Status: AC
  Administered 2016-04-22 (×5): 10 meq via INTRAVENOUS
  Filled 2016-04-22 (×5): qty 100

## 2016-04-22 MED ORDER — SODIUM CHLORIDE 0.9% FLUSH
10.0000 mL | INTRAVENOUS | Status: DC | PRN
  Administered 2016-04-24: 10 mL
  Administered 2016-04-25: 20 mL
  Filled 2016-04-22 (×2): qty 40

## 2016-04-22 NOTE — Progress Notes (Signed)
MD updated via text page that pt MAP 65-68 last few readings.. Patient has had 3 non-sustaining bradycardic episodes with HR as low as 46. Patient asymptomatic before, during, and after episodes. Patient still on 3LNC with saturations 100%. PICC has not been placed yet as patient has not met minimum BP requirements. Will continue to monitor patient closely.

## 2016-04-22 NOTE — Progress Notes (Signed)
Approval received from MD Ala DachFord to change order for PICC from triple to double lumen.

## 2016-04-22 NOTE — Progress Notes (Addendum)
Peripherally Inserted Central Catheter/Midline Placement  The IV Nurse has discussed with the patient and/or persons authorized to consent for the patient, the purpose of this procedure and the potential benefits and risks involved with this procedure.  The benefits include less needle sticks, lab draws from the catheter, ability to perform a PICC exchange if needed/ordered by physician and patient may be discharged home with the catheter.  Risks include, but not limited to, infection, bleeding, blood clot (thrombus formation), and puncture of an artery; nerve damage and irregular heart beat.  Alternatives to this procedure were also discussed.  Phone consent signed by sister.  Bard education booklet, information card and fact sheet left at bedside for family.  PICC/Midline Placement Documentation  PICC Double Lumen 04/22/16 PICC Right Brachial 38 cm 0 cm (Active)  Indication for Insertion or Continuance of Line Vasoactive infusions 04/22/2016  5:15 PM  Exposed Catheter (cm) 0 cm 04/22/2016  5:15 PM  Site Assessment Clean;Dry;Intact 04/22/2016  5:15 PM  Lumen #1 Status Flushed;Saline locked;Blood return noted 04/22/2016  5:15 PM  Lumen #2 Status Flushed;Saline locked;Blood return noted 04/22/2016  5:15 PM  Dressing Type Transparent 04/22/2016  5:15 PM  Dressing Status Clean;Dry;Intact 04/22/2016  5:15 PM  Dressing Change Due 04/29/16 04/22/2016  5:15 PM       Ethelda Chickurrie, Allen Robert 04/22/2016, 5:17 PM

## 2016-04-22 NOTE — Progress Notes (Signed)
Three phlebotomy techs unsuccessful in lab draw. MD notified. May need a PICC.

## 2016-04-22 NOTE — Progress Notes (Signed)
Pharmacy Antibiotic Note  Anthony Heath is a 70 y.o. male admitted on 04/21/2016 with 1 day history of intermittent productive cough and altered mental status. He was being treated with Vancomycin and Zosyn but now narrowing to Unasyn monotherapy for aspiration pneumonia. Cultures remain negative. WBC down to 11.3. Afebrile. CrCl ~ 80 mL/min  Plan: -Stop Vancomycin and Zosyn -Start Unasyn 1.5 gm IV Q 6 hours -Monitor cultures and clinical progress -Pharmacy to sign off since renal fx is stable and no further antibiotic dosage adjustments necessary   Height: 5\' 9"  (175.3 cm) Weight: 144 lb 13.5 oz (65.7 kg) IBW/kg (Calculated) : 70.7  Temp (24hrs), Avg:98.6 F (37 C), Min:98 F (36.7 C), Max:99.2 F (37.3 C)   Recent Labs Lab 04/21/16 1038 04/21/16 1055 04/21/16 1237 04/21/16 1826 04/21/16 2007 04/22/16 0727  WBC  --  17.9*  --   --   --  11.3*  CREATININE  --  1.16 1.08 0.85 0.91 0.79  LATICACIDVEN 2.04*  --   --   --  2.3* 1.2    Estimated Creatinine Clearance: 82.1 mL/min (by C-G formula based on Cr of 0.79).    No Known Allergies  Antimicrobials this admission: 6/10 Vanc>> 6/10 Zosyn x 1 dose   Dose adjustments this admission: None   Microbiology results: 6/10 BCx2>>ngtd 6/10 UCx>> sent  Thank you for allowing pharmacy to be a part of this patient's care.  Vinnie LevelBenjamin Avian Greenawalt, PharmD., BCPS Clinical Pharmacist Pager [phone]

## 2016-04-22 NOTE — Progress Notes (Signed)
Took pt off Bipap and placed on 4l.  Vital signs remained stable

## 2016-04-22 NOTE — Progress Notes (Signed)
Utilization review completed.  

## 2016-04-22 NOTE — Progress Notes (Addendum)
Subjective:  Anthony Heath nods his head "yes" through the BiPAP when asked if he is breathing and feeling better this morning. Otherwise, he had difficulty communicating through the BiPAP, and is minimally conversant at baseline  Objective: Vital signs in last 24 hours: Filed Vitals:   04/22/16 0011 04/22/16 0430 04/22/16 0700 04/22/16 0836  BP: 88/70 82/55 81/57  81/51  Pulse: 80 67 65   Temp: 98 F (36.7 C) 98 F (36.7 C)  98.1 F (36.7 C)  TempSrc: Oral Axillary  Oral  Resp: 25 26 31 22   Height:      Weight:      SpO2: 100% 100% 100% 100%   Weight change:   Intake/Output Summary (Last 24 hours) at 04/22/16 0920 Last data filed at 04/22/16 0848  Gross per 24 hour  Intake 1405.42 ml  Output    950 ml  Net 455.42 ml   Physical Exam General: Cachectic male. Lying in bed, NAD, BiPAP in place Cardiovascular: RRR, no m/r/g Pulmonary: Decreased breath sounds. No wheezing or rhonchi. No respiratory distress, not using accessory muscles Abdominal: Soft, normal bowel sounds Extremities: Warm and Dry Neurological: Resting, rhythmic tremor in left hand, hemiparesis on right. No tremor in LLE today.  Lab Results: Basic Metabolic Panel:  Recent Labs Lab 04/21/16 2007 04/22/16 0727  NA 155* 154*  K 2.8* 2.9*  CL 127* 126*  CO2 22 22  GLUCOSE 143* 121*  BUN 14 10  CREATININE 0.91 0.79  CALCIUM 8.4* 8.4*  MG  --  1.9   CBC:  Recent Labs Lab 04/21/16 1055 04/22/16 0727  WBC 17.9* 11.3*  NEUTROABS 12.7*  --   HGB 13.0 10.2*  HCT 41.8 33.5*  MCV 91.7 91.3  PLT 198 159   D-Dimer:  Recent Labs Lab 04/21/16 1826  DDIMER 17.14*   CBG:  Recent Labs Lab 04/22/16 0839  GLUCAP 98    Urinalysis:  Recent Labs Lab 04/21/16 1019  COLORURINE YELLOW  LABSPEC 1.014  PHURINE 6.0  GLUCOSEU NEGATIVE  HGBUR MODERATE*  BILIRUBINUR NEGATIVE  KETONESUR NEGATIVE  PROTEINUR NEGATIVE  NITRITE NEGATIVE  LEUKOCYTESUR LARGE*    Micro Results: Recent Results (from  the past 240 hour(s))  Blood culture (routine x 2)     Status: None (Preliminary result)   Collection Time: 04/21/16 10:30 AM  Result Value Ref Range Status   Specimen Description BLOOD LEFT HAND  Final   Special Requests BOTTLES DRAWN AEROBIC AND ANAEROBIC 5CC  Final   Culture NO GROWTH < 12 HOURS  Final   Report Status PENDING  Incomplete  Blood culture (routine x 2)     Status: None (Preliminary result)   Collection Time: 04/21/16 10:38 AM  Result Value Ref Range Status   Specimen Description BLOOD RIGHT HAND  Final   Special Requests BOTTLES DRAWN AEROBIC AND ANAEROBIC 10CC  Final   Culture NO GROWTH < 12 HOURS  Final   Report Status PENDING  Incomplete  MRSA PCR Screening     Status: None   Collection Time: 04/21/16  4:32 PM  Result Value Ref Range Status   MRSA by PCR NEGATIVE NEGATIVE Final    Comment:        The GeneXpert MRSA Assay (FDA approved for NASAL specimens only), is one component of a comprehensive MRSA colonization surveillance program. It is not intended to diagnose MRSA infection nor to guide or monitor treatment for MRSA infections. [facility]    Studies/Results: Ct Head Wo  Contrast  04/21/2016  CLINICAL DATA:  70 year old male with a history of new right-sided deficits EXAM: CT HEAD WITHOUT CONTRAST TECHNIQUE: Contiguous axial images were obtained from the base of the skull through the vertex without intravenous contrast. COMPARISON:  07/19/2014 FINDINGS: Unremarkable appearance of the calvarium without acute fracture or aggressive lesion. Unremarkable appearance of the scalp soft tissues. Unremarkable appearance of the bilateral orbits. Debris within the bilateral external auditory canal. Mastoid air cells are clear. No significant paranasal sinus disease No acute intracranial hemorrhage.  No midline shift or mass effect. Similar appearance of diffuse brain volume loss and expansion of the ventricles. Confluent hypodensity in  the bilateral periventricular white matter is unchanged. Intracranial atherosclerosis. IMPRESSION: No CT evidence of acute intracranial abnormality. Similar appearance of chronic white matter disease and intracranial atherosclerosis. Brain volume loss, similar to prior. Signed, Yvone Neu. Loreta Ave, DO [facility] Electronically Signed   By: Gilmer Mor D.O.   On: 04/21/2016 13:44   Ct Angio Chest Pe W/cm &/or Wo Cm  04/21/2016  CLINICAL DATA:  Hypoxia. EXAM: CT ANGIOGRAPHY CHEST WITH CONTRAST TECHNIQUE: Multidetector CT imaging of the chest was performed using the standard protocol during bolus administration of intravenous contrast. Multiplanar CT image reconstructions and MIPs were obtained to evaluate the vascular anatomy. CONTRAST:  70 cc Isovue 370 COMPARISON:  Portable chest obtained earlier today. FINDINGS: Mediastinum/Lymph Nodes: No pulmonary emboli or thoracic aortic dissection identified. No masses or pathologically enlarged lymph nodes identified. Lungs/Pleura: Interval extensive, multifocal, patchy opacity in both lungs. This is most pronounced and most confluent in the right perihilar region, involving all 3 lobes, most pronounced involving the right lower lobe. No pleural fluid. Upper abdomen: No acute findings. Musculoskeletal: Thoracic and lower cervical spine degenerative changes. Review of the MIP images confirms the above findings. IMPRESSION: 1. Interval extensive patchy, multifocal and confluent opacity both lungs, most pronounced in the right perihilar region and right lower lobe. This is compatible with interval extensive multilobar pneumonia. This could be due to septic emboli or atypical pneumonia. 2. Normally opacified pulmonary arteries with no pulmonary emboli seen. Electronically Signed   By: Beckie Salts M.D.   On: 04/21/2016 22:48   Dg Chest Port 1 View  04/22/2016  CLINICAL DATA:  Sepsis EXAM: PORTABLE CHEST 1 VIEW  COMPARISON:  Plain film and chest CT of 1 day prior FINDINGS: Midline trachea. Normal heart size. No pleural effusion or pneumothorax. Since the prior plain film, increase in multifocal, right greater than left airspace opacities. The most confluent area is in the right perihilar region. A skin fold projects the left hemi thorax. IMPRESSION: Progressive multifocal airspace opacities, most consistent with pneumonia. Electronically Signed   By: Jeronimo Greaves M.D.   On: 04/22/2016 07:18   Dg Chest Portable 1 View  04/21/2016  CLINICAL DATA:  70 year old male with a history of altered mental status EXAM: PORTABLE CHEST 1 VIEW COMPARISON:  04/10/2016 FINDINGS: Right rotation. Cardiomediastinal silhouette unchanged. No confluent airspace disease pneumothorax or pleural effusion. Coarsened interstitial markings similar to prior. No displaced fracture. IMPRESSION: Chronic lung changes without evidence of superimposed acute cardiopulmonary disease. Signed, Yvone Neu. Loreta Ave, DO [facility] Electronically Signed   By: Gilmer Mor D.O.   On: 04/21/2016 11:07   Medications: I have reviewed the patient's current medications. Scheduled Meds: . enoxaparin (LOVENOX) injection  40 mg Subcutaneous Q24H  . piperacillin-tazobactam (ZOSYN)  IV  3.375 g Intravenous Q8H  . potassium  chloride  10 mEq Intravenous Q1 Hr x 5  . vancomycin  500 mg Intravenous Q12H   Continuous Infusions: . dextrose 5 % and 0.45% NaCl 75 mL/hr at 04/22/16 0903   PRN Meds:.acetaminophen **OR** acetaminophen Assessment/Plan:  Sepsis presumed secondary to Aspiration Pneumonia:  Patient appears to be responding to broad spectrum with antibiotics, with resolving leukocytosis, tachycardia, and lactic acidosis. CTA chest, which was obtained in the setting of elevated D-dimer for [place] 3, showed no PE but showed a multifocal pneumonia most pronounced in the RLL. Given the patient's recent  swallowing difficulty, this most likely represents an aspiration pneumonia, although UTI is a possibility (discussed below). We will narrow her antibiotics to Unasyn. Patient hypotensive with MAP 56 this AM so will provide another 2L fluid bolus on top of D5 1/2 NS. Poor po intake and cachexia likely contributing to hypotension. Patient has had difficulty IV access and will likely need recurrent labs and IV antibiotics. - Unasyn per pharm - PICC placement - triple lumen - 2L NS Fluid Bolus - BCx NTD, UCx pending - CBC in AM  Acute Respiratory Failure: Due to aspiration pneumonia.ABG yesterday shows respiratory acidosis improving on BiPAP, we will do a trial off BiPAP today and monitor his respiratory status.  - Monitor O2 and WOB  Complicated UTI: Large leuks on UA, negative nitrites. Previously positive to enterococcus in 2011, which Unasyn should cover. - Abx as above - UCx pending  Hypernatremia: NA stabe in mid 150s. Will continue D5 1/2NS with a goal of no more than 8 mEq per 24 hour period. Likely due to poor PO intake. - D5 1/2NS at 75 cc/hr - BMET this afternoon  Hypokalemia: K initially 5.6 w/o EKG changes (likely due to hemolysis. 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 May be leading to increased risk of aspiration. It is unclear at this time why L-DOPA or dopaminergic agents have not been attempted. - Consider intiating L-DOPA, neurology referral on discharge once acute medical problems addressed - Consider palliative care consult  Right Hemiparesis: From previous CVA. No pressure ulcers per nursing report. - Pillow between legs to prevent ulcers - Consider palliative care consult  DVT PPx: Lovenox [place]  Dispo: Disposition is deferred at this time, awaiting improvement of current medical problems.  Anticipated discharge in approximately  3-4 day(s).   The patient does not have a current PCP, follows with [facility]. He   does not need an [facility] hospital follow-up appointment after discharge.  The patient does have transportation limitations that hinder transportation to clinic appointments.  .Services Needed at time of discharge: Y = Yes, Blank = No PT:   OT:   RN:   Equipment:   Other:     LOS: 1 day   Ruben Im, MD 04/22/2016, 9:20 AM

## 2016-04-22 NOTE — Progress Notes (Signed)
Spoke with Architectfloor RN.  BP pressures low at present.  Bolus being given.  Will wait on pressures improvement before proceeding with PICC placement.

## 2016-04-23 DIAGNOSIS — R1310 Dysphagia, unspecified: Secondary | ICD-10-CM

## 2016-04-23 LAB — BASIC METABOLIC PANEL
ANION GAP: 5 (ref 5–15)
ANION GAP: 4 — AB (ref 5–15)
Anion gap: 3 — ABNORMAL LOW (ref 5–15)
BUN: <5 mg/dL — ABNORMAL LOW (ref 6–20)
BUN: <5 mg/dL — ABNORMAL LOW (ref 6–20)
BUN: <5 mg/dL — ABNORMAL LOW (ref 6–20)
CALCIUM: 7.9 mg/dL — AB (ref 8.9–10.3)
CHLORIDE: 125 mmol/L — AB (ref 101–111)
CHLORIDE: 126 mmol/L — AB (ref 101–111)
CHLORIDE: 129 mmol/L — AB (ref 101–111)
CO2: 18 mmol/L — ABNORMAL LOW (ref 22–32)
CO2: 20 mmol/L — ABNORMAL LOW (ref 22–32)
CO2: 20 mmol/L — AB (ref 22–32)
Calcium: 7.6 mg/dL — ABNORMAL LOW (ref 8.9–10.3)
Calcium: 7.7 mg/dL — ABNORMAL LOW (ref 8.9–10.3)
Creatinine, Ser: 0.67 mg/dL (ref 0.61–1.24)
Creatinine, Ser: 0.72 mg/dL (ref 0.61–1.24)
Creatinine, Ser: 0.67 mg/dL (ref 0.61–1.24)
GFR calc non Af Amer: >60 mL/min (ref >60)
GFR calc non Af Amer: >60 mL/min (ref >60)
GFR calc non Af Amer: >60 mL/min (ref >60)
Glucose, Bld: 93 mg/dL (ref 65–99)
Glucose, Bld: 94 mg/dL (ref 65–99)
Glucose, Bld: 82 mg/dL (ref 65–99)
POTASSIUM: 2.8 mmol/L — AB (ref 3.5–5.1)
POTASSIUM: 3.1 mmol/L — AB (ref 3.5–5.1)
POTASSIUM: 3.6 mmol/L (ref 3.5–5.1)
SODIUM: 150 mmol/L — AB (ref 135–145)
SODIUM: 151 mmol/L — AB (ref 135–145)
Sodium: 149 mmol/L — ABNORMAL HIGH (ref 135–145)

## 2016-04-23 LAB — CBC
HEMATOCRIT: 32 % — AB (ref 39.0–52.0)
HEMOGLOBIN: 10 g/dL — AB (ref 13.0–17.0)
MCH: 27.7 pg (ref 26.0–34.0)
MCHC: 31.3 g/dL (ref 30.0–36.0)
MCV: 88.6 fL (ref 78.0–100.0)
PLATELETS: 165 10*3/uL (ref 150–400)
RBC: 3.61 MIL/uL — AB (ref 4.22–5.81)
RDW: 16.3 % — ABNORMAL HIGH (ref 11.5–15.5)
WBC: 9.9 10*3/uL (ref 4.0–10.5)

## 2016-04-23 LAB — ALBUMIN: Albumin: 1.6 g/dL — ABNORMAL LOW (ref 3.5–5.0)

## 2016-04-23 LAB — URINE CULTURE

## 2016-04-23 MED ORDER — POTASSIUM CHLORIDE 10 MEQ/100ML IV SOLN
10.0000 meq | INTRAVENOUS | Status: AC
  Administered 2016-04-23 (×4): 10 meq via INTRAVENOUS
  Filled 2016-04-23 (×4): qty 100

## 2016-04-23 MED ORDER — KCL IN DEXTROSE-NACL 20-5-0.45 MEQ/L-%-% IV SOLN
INTRAVENOUS | Status: AC
  Administered 2016-04-23 – 2016-04-24 (×2): via INTRAVENOUS
  Filled 2016-04-23 (×2): qty 1000

## 2016-04-23 MED ORDER — POTASSIUM CHLORIDE 10 MEQ/100ML IV SOLN: 10.0000 meq | INTRAVENOUS | Status: DC

## 2016-04-23 NOTE — Evaluation (Signed)
Clinical/Bedside Swallow Evaluation Patient Details  Name: Anthony Heath MRN: [id number] Date of Birth: 1946-08-11  Today's Date: 04/23/2016 Time: SLP Start Time (ACUTE ONLY): 0825 SLP Stop Time (ACUTE ONLY): 0845 SLP Time Calculation (min) (ACUTE ONLY): 20 min  Past Medical History:  Past Medical History  Diagnosis Date  . Stroke [facility])    Past Surgical History: History reviewed. No pertinent past surgical history. 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 He presented with SOB. He has a soft voice and has difficulty speaking. He was also on BIPAP when I saw him. Much of the history was obtained from discussion with resident team and chart review. His symptoms started about 3 days ago when he developed problems swallowing, and decreased his intake. There was subjective fevers and a change in urine smell. Further symptoms included a new cough and more difficulty breathing. The patient was not complaining of much. Dr. Marily Lente note reports extensive discussion with the family. His functional decline seems to have started after his stroke in 2011 and has been progressively getting worse; CXR on 04/22/16 indicated progressive multifocal airspace opacities, most consistent with PNA.   Assessment / Plan / Recommendation Clinical Impression   Pt with severe oropharyngeal neurogenic dysphagia characterized by multiple s/s of aspiration including wet vocal quality with low intensity  (prior to BSE and during), labial spillage on left prior to and during intake, severe dysarthria with speech being about 40% intelligible with verbal cues to increase volume during speaking, overall oral weakness/decreased oral ROM and delayed throat clearing throughout trials of PO's.  Pt should remain NPO at this time d/t severe aspiration risk and ST will f/u for tolerance of PO trials.    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 He presented with SOB. He has a soft voice and has difficulty speaking. He was also on BIPAP when I saw him. Much of the history was obtained from discussion with resident team and chart review. His symptoms started about 3 days ago when he developed problems swallowing, and decreased his intake. There was subjective fevers and a change in urine smell. Further symptoms included a new cough and more difficulty breathing. The patient was not complaining of much. Dr. Marily Lente note reports extensive discussion with the family. His functional decline seems to have started after his stroke in 2011 and has been progressively getting worse.  Type of Study: Bedside Swallow Evaluation Previous Swallow Assessment: n/a Diet Prior to this Study: NPO Temperature Spikes Noted: Yes Respiratory Status: Nasal cannula History of Recent Intubation: No Behavior/Cognition: Alert;Cooperative;Other (Comment) (severely dysarthric) Oral Cavity Assessment: Excessive secretions Oral Care Completed by SLP: Recent completion by staff Oral Cavity - Dentition: Poor condition;Missing dentition Vision: Impaired for self-feeding Self-Feeding Abilities: Needs assist;Needs set up Patient Positioning: Upright in bed Baseline Vocal Quality: Wet;Low vocal intensity Volitional Cough: Weak Volitional Swallow: Able to elicit    Oral/Motor/Sensory Function  Overall Oral Motor/Sensory Function: Severe impairment Facial ROM: Reduced right;Reduced left Facial  Symmetry: Suspected CN VII (facial) dysfunction Facial Strength: Suspected CN VII (facial) dysfunction Lingual ROM: Other (Comment) (reduced) Lingual Symmetry: Abnormal symmetry right Lingual Strength: Reduced Velum: Other (comment) (snoring respirations during BSE; ? velar insufficiency)   Ice Chips Ice chips: Impaired Presentation: Spoon Oral Phase Impairments: Reduced labial seal;Reduced lingual movement/coordination Oral Phase Functional Implications: Left anterior spillage;Prolonged oral transit;Oral holding Pharyngeal Phase Impairments: Suspected delayed Swallow;Multiple swallows;Wet Vocal Quality   Thin Liquid Thin Liquid: Impaired Presentation: Spoon Oral Phase Impairments: Reduced lingual movement/coordination Oral Phase Functional Implications: Left anterior spillage;Prolonged oral transit;Oral holding Pharyngeal  Phase Impairments: Suspected delayed Swallow;Multiple swallows;Wet Vocal Quality;Throat Clearing - Delayed    Nectar Thick Nectar Thick Liquid: Impaired Presentation: Spoon Oral Phase Impairments: Reduced lingual movement/coordination Oral phase functional implications: Left anterior spillage;Prolonged oral transit Pharyngeal Phase Impairments: Suspected delayed Swallow;Multiple swallows;Wet Vocal Quality;Throat Clearing - Delayed   Honey Thick Honey Thick Liquid: Not tested   Puree Puree: Impaired Presentation: Spoon Oral Phase Impairments: Reduced lingual movement/coordination Oral Phase Functional Implications: Prolonged oral transit;Oral holding Pharyngeal Phase Impairments: Suspected delayed Swallow;Multiple swallows;Throat Clearing - Delayed   Solid      Solid: Not tested    Functional Assessment Tool Used: NOMS Functional Limitations: Swallowing Swallow Current Status ([id number]): At least 80 percent but less than 100 percent impaired, limited or restricted Swallow Goal Status [id number]([id number]): At least 40 percent but less than 60 percent impaired, limited or  restricted   [name],PAT, M.S., CCC-SLP 04/23/2016,10:19 AM

## 2016-04-23 NOTE — Plan of Care (Signed)
Problem: Nutrition: Goal: Adequate nutrition will be maintained Outcome: Progressing Given patient and discussed with teaching services about potassium levels

## 2016-04-23 NOTE — Progress Notes (Signed)
Subjective: Alert and more interactive, on room air.   Objective: Vital signs in last 24 hours: Filed Vitals:   04/23/16 0353 04/23/16 0400 04/23/16 0500 04/23/16 0600  BP: 100/62     Pulse: 62 63 55 56  Temp:  99.1 F (37.3 C)    TempSrc:  Oral    Resp: Height:      Weight:      SpO2: 97% 97% 96% 98%   Weight change:   Intake/Output Summary (Last 24 hours) at 04/23/16 4098 Last data filed at 04/23/16 0600  Gross per 24 hour  Intake 4863.75 ml  Output   2200 ml  Net 2663.75 ml   Physical Exam General: Cachectic male. Lying in bed, nods to yes and no questins.  Cardiovascular: RRR, no m/r/g Pulmonary: clear to anterior auscultation Abdominal: Soft, normal bowel sounds Extremities: Warm and Dry, rt PICC line site clean Neurological: Resting, rhythmic tremor in left hand, hemiparesis on right. Rt facial droop.   Lab Results: Basic Metabolic Panel:  Recent Labs Lab 04/22/16 0727  04/22/16 2142 04/23/16 0001  NA 154*  < > 150* 150*  K 2.9*  < > 3.1* 2.8*  CL 126*  < > 125* 129*  CO2 22  < > 18* 18*  GLUCOSE 121*  < > 91 93  BUN 10  < > 5* <5*  CREATININE 0.79  < > 0.72 0.67  CALCIUM 8.4*  < > 7.4* 7.6*  MG 1.9  --   --   --   < > = values in this interval not displayed. CBC:  Recent Labs Lab 04/21/16 1055 04/22/16 0727  WBC 17.9* 11.3*  NEUTROABS 12.7*  --   HGB 13.0 10.2*  HCT 41.8 33.5*  MCV 91.7 91.3  PLT 198 159   D-Dimer:  Recent Labs Lab 04/21/16 1826  DDIMER 17.14*   CBG:  Recent Labs Lab 04/22/16 0839 04/22/16 1220 04/22/16 1644  GLUCAP 98 274* 95    Urinalysis:  Recent Labs Lab 04/21/16 1019  COLORURINE YELLOW  LABSPEC 1.014  PHURINE 6.0  GLUCOSEU NEGATIVE  HGBUR MODERATE*  BILIRUBINUR NEGATIVE  KETONESUR NEGATIVE  PROTEINUR NEGATIVE  NITRITE NEGATIVE  LEUKOCYTESUR LARGE*    Micro Results: Recent Results (from the past 240 hour(s))  Blood culture (routine x 2)     Status: None (Preliminary result)    Collection Time: 04/21/16 10:30 AM  Result Value Ref Range Status   Specimen Description BLOOD LEFT HAND  Final   Special Requests BOTTLES DRAWN AEROBIC AND ANAEROBIC 5CC  Final   Culture NO GROWTH 1 DAY  Final   Report Status PENDING  Incomplete  Blood culture (routine x 2)     Status: None (Preliminary result)   Collection Time: 04/21/16 10:38 AM  Result Value Ref Range Status   Specimen Description BLOOD RIGHT HAND  Final   Special Requests BOTTLES DRAWN AEROBIC AND ANAEROBIC 10CC  Final   Culture NO GROWTH 1 DAY  Final   Report Status PENDING  Incomplete  MRSA PCR Screening     Status: None   Collection Time: 04/21/16  4:32 PM  Result Value Ref Range Status   MRSA by PCR NEGATIVE NEGATIVE Final    Comment:        The GeneXpert MRSA Assay (FDA approved for NASAL specimens only), is one component of a comprehensive MRSA colonization surveillance program. It is not intended to diagnose MRSA infection nor to guide or monitor treatment  for MRSA infections. Performed at [facility]    Studies/Results: Ct Head Wo Contrast  04/21/2016  CLINICAL DATA:  70 year old male with a history of new right-sided deficits EXAM: CT HEAD WITHOUT CONTRAST TECHNIQUE: Contiguous axial images were obtained from the base of the skull through the vertex without intravenous contrast. COMPARISON:  07/19/2014 FINDINGS: Unremarkable appearance of the calvarium without acute fracture or aggressive lesion. Unremarkable appearance of the scalp soft tissues. Unremarkable appearance of the bilateral orbits. Debris within the bilateral external auditory canal. Mastoid air cells are clear. No significant paranasal sinus disease No acute intracranial hemorrhage.  No midline shift or mass effect. Similar appearance of diffuse brain volume loss and expansion of the ventricles. Confluent hypodensity in the bilateral periventricular white matter is unchanged. Intracranial atherosclerosis. IMPRESSION:  No CT evidence of acute intracranial abnormality. Similar appearance of chronic white matter disease and intracranial atherosclerosis. Brain volume loss, similar to prior. Signed, Yvone NeuJaime S. Loreta AveWagner, DO [facility] Electronically Signed   By: Gilmer MorJaime  Wagner D.O.   On: 04/21/2016 13:44   Ct Angio Chest Pe W/cm &/or Wo Cm  04/21/2016  CLINICAL DATA:  Hypoxia. EXAM: CT ANGIOGRAPHY CHEST WITH CONTRAST TECHNIQUE: Multidetector CT imaging of the chest was performed using the standard protocol during bolus administration of intravenous contrast. Multiplanar CT image reconstructions and MIPs were obtained to evaluate the vascular anatomy. CONTRAST:  70 cc Isovue 370 COMPARISON:  Portable chest obtained earlier today. FINDINGS: Mediastinum/Lymph Nodes: No pulmonary emboli or thoracic aortic dissection identified. No masses or pathologically enlarged lymph nodes identified. Lungs/Pleura: Interval extensive, multifocal, patchy opacity in both lungs. This is most pronounced and most confluent in the right perihilar region, involving all 3 lobes, most pronounced involving the right lower lobe. No pleural fluid. Upper abdomen: No acute findings. Musculoskeletal: Thoracic and lower cervical spine degenerative changes. Review of the MIP images confirms the above findings. IMPRESSION: 1. Interval extensive patchy, multifocal and confluent opacity both lungs, most pronounced in the right perihilar region and right lower lobe. This is compatible with interval extensive multilobar pneumonia. This could be due to septic emboli or atypical pneumonia. 2. Normally opacified pulmonary arteries with no pulmonary emboli seen. Electronically Signed   By: Beckie SaltsSteven  Reid M.D.   On: 04/21/2016 22:48   Dg Chest Port 1 View  04/22/2016  CLINICAL DATA:  Sepsis EXAM: PORTABLE CHEST 1 VIEW COMPARISON:  Plain film and chest CT of 1 day prior FINDINGS: Midline trachea. Normal heart size. No  pleural effusion or pneumothorax. Since the prior plain film, increase in multifocal, right greater than left airspace opacities. The most confluent area is in the right perihilar region. A skin fold projects the left hemi thorax. IMPRESSION: Progressive multifocal airspace opacities, most consistent with pneumonia. Electronically Signed   By: Jeronimo GreavesKyle  Talbot M.D.   On: 04/22/2016 07:18   Dg Chest Portable 1 View  04/21/2016  CLINICAL DATA:  70 year old male with a history of altered mental status EXAM: PORTABLE CHEST 1 VIEW COMPARISON:  04/10/2016 FINDINGS: Right rotation. Cardiomediastinal silhouette unchanged. No confluent airspace disease pneumothorax or pleural effusion. Coarsened interstitial markings similar to prior. No displaced fracture. IMPRESSION: Chronic lung changes without evidence of superimposed acute cardiopulmonary disease. Signed, Yvone NeuJaime S. Loreta AveWagner, DO [facility] Electronically Signed   By: Gilmer MorJaime  Wagner D.O.   On: 04/21/2016 11:07   Medications: I have reviewed the patient's current medications. Scheduled Meds: . ampicillin-sulbactam (UNASYN) IV  1.5 g  Intravenous Q6H  . enoxaparin (LOVENOX) injection  40 mg Subcutaneous Q24H  . sodium chloride flush  10-40 mL Intracatheter Q12H   Continuous Infusions: . dextrose 5 % and 0.45 % NaCl with KCl 40 mEq/L 75 mL/hr at 04/23/16 0038   PRN Meds:.acetaminophen **OR** acetaminophen, sodium chloride flush Assessment/Plan:  Sepsis presumed secondary to Aspiration Pneumonia:  Received 4L NS yesterday for low blood pressures, MAP 75 today and sating well on room air. PICC line placed yesterday.  - Unasyn per pharm - BCx NTD, UCx pending - transfer to med surg - SLP consulted  Acute Respiratory Failure:  resolved, on room air.   Complicated UTI: Large leuks on UA, negative nitrites. Previously positive to enterococcus in 2011, which Unasyn should cover. - Abx as above - UCx in  process  Hypernatremia: sodium stable at 151, given 4L bolus of NS yesterday for low blood pressure contributing to hypernatremia.  - D5 1/2NS at 75 cc/hr - BMET at 2:00 pm   Hypokalemia: iatrogenic from IVF. K 3.1 this morning, on D5 1/2 NS w/ .  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 May be leading to increased risk of aspiration. It is unclear at this time why L-DOPA or dopaminergic agents have not been attempted. - Consider intiating L-DOPA, neurology referral on discharge once acute medical problems addressed - Consider palliative care consult  Right Hemiparesis: From previous CVA. No pressure ulcers per nursing report. - Pillow between legs to prevent ulcers - Consider palliative care consult  DVT PPx: Lovenox [place]  Dispo: Disposition is deferred at this time, awaiting improvement of current medical problems.   The patient does not have a current PCP, follows with [facility]. He   does not need an [facility] hospital follow-up appointment after discharge.  The patient does have transportation limitations that hinder transportation to clinic appointments.  .Services Needed at time of discharge: Y = Yes, Blank = No PT:   OT:   RN:   Equipment:   Other:     LOS: 2 days   Denton Brick, MD 04/23/2016, 7:12 AM

## 2016-04-24 DIAGNOSIS — R8271 Bacteriuria: Secondary | ICD-10-CM

## 2016-04-24 DIAGNOSIS — I69392 Facial weakness following cerebral infarction: Secondary | ICD-10-CM

## 2016-04-24 LAB — BASIC METABOLIC PANEL
ANION GAP: 5 (ref 5–15)
CO2: 22 mmol/L (ref 22–32)
Calcium: 8 mg/dL — ABNORMAL LOW (ref 8.9–10.3)
Chloride: 122 mmol/L — ABNORMAL HIGH (ref 101–111)
Creatinine, Ser: 0.75 mg/dL (ref 0.61–1.24)
GFR calc Af Amer: >60 mL/min (ref >60)
GFR calc non Af Amer: >60 mL/min (ref >60)
GLUCOSE: 93 mg/dL (ref 65–99)
POTASSIUM: 3 mmol/L — AB (ref 3.5–5.1)
Sodium: 149 mmol/L — ABNORMAL HIGH (ref 135–145)

## 2016-04-24 MED ORDER — RESOURCE THICKENUP CLEAR PO POWD
ORAL | Status: DC | PRN
  Filled 2016-04-24 (×2): qty 125

## 2016-04-24 MED ORDER — POTASSIUM CHLORIDE 10 MEQ/100ML IV SOLN
10.0000 meq | INTRAVENOUS | Status: AC
  Administered 2016-04-24 (×4): 10 meq via INTRAVENOUS
  Filled 2016-04-24 (×2): qty 100

## 2016-04-24 MED ORDER — CARBIDOPA-LEVODOPA 10-100 MG PO TABS
1.0000 | ORAL_TABLET | Freq: Three times a day (TID) | ORAL | Status: DC
  Administered 2016-04-24 – 2016-04-25 (×3): 1 via ORAL
  Filled 2016-04-24 (×4): qty 1

## 2016-04-24 NOTE — Progress Notes (Signed)
No charge note.  Spoke briefly with Ms. Staples.  We plan to meet at Mr. Azzarello' room at 9:30 am on 6/14.  Algis DownsMarianne York, [place] JerseyPA-C Palliative Medicine Pager: [phone]

## 2016-04-24 NOTE — Progress Notes (Signed)
Subjective: Patient has no complaints this morning. He indicates that he feels better.  Objective: Vital signs in last 24 hours: Filed Vitals:   04/23/16 2342 04/24/16 0000 04/24/16 0113 04/24/16 0617  BP: 130/57  112/82 131/64  Pulse: 50 55 55 58  Temp: 98.5 F (36.9 C)  98.7 F (37.1 C) 98.6 F (37 C)  TempSrc: Oral  Oral Oral  Resp: Height:    (1.753 m)   Weight:   125 lb 12.8 oz (57.063 kg)   SpO2: 97% 97% 97% 100%   Weight change:   Intake/Output Summary (Last 24 hours) at 04/24/16 1007 Last data filed at 04/24/16 0900  Gross per 24 hour  Intake 1442.5 ml  Output   2850 ml  Net -1407.5 ml   Physical Exam General: Cachectic male. Lying in bed, nods to yes and no questions.  Cardiovascular: RRR, no m/r/g Pulmonary: Upper respiratory sounds, otherwise CTAB. Breathing unlabored Abdominal: Soft, normal bowel sounds Extremities: Warm and Dry Neurological: Alert,, rhythmic tremor in left hand, hemiparesis on right. Rt facial droop.   Lab Results: Basic Metabolic Panel:  Recent Labs Lab 04/22/16 0727  04/23/16 1515 04/24/16 0530  NA 154*  < > 149* 149*  K 2.9*  < > 3.6 3.0*  CL 126*  < > 125* 122*  CO2 22  < > 20* 22  GLUCOSE 121*  < > 82 93  BUN 10  < > <5* <5*  CREATININE 0.79  < > 0.67 0.75  CALCIUM 8.4*  < > 7.9* 8.0*  MG 1.9  --   --   --   < > = values in this interval not displayed. CBC:  Recent Labs Lab 04/21/16 1055 04/22/16 0727 04/23/16 0835  WBC 17.9* 11.3* 9.9  NEUTROABS 12.7*  --   --   HGB 13.0 10.2* 10.0*  HCT 41.8 33.5* 32.0*  MCV 91.7 91.3 88.6  PLT 198 159 165   CBG:  Recent Labs Lab 04/22/16 0839 04/22/16 1220 04/22/16 1644  GLUCAP 98 274* 95    Urinalysis:  Recent Labs Lab 04/21/16 1019  COLORURINE YELLOW  LABSPEC 1.014  PHURINE 6.0  GLUCOSEU NEGATIVE  HGBUR MODERATE*  BILIRUBINUR NEGATIVE  KETONESUR NEGATIVE  PROTEINUR NEGATIVE  NITRITE NEGATIVE  LEUKOCYTESUR LARGE*    Micro  Results: Recent Results (from the past 240 hour(s))  Culture, Urine     Status: Abnormal   Collection Time: 04/21/16 10:19 AM  Result Value Ref Range Status   Specimen Description URINE, RANDOM  Final   Special Requests ADDED 2203  Final   Culture MULTIPLE SPECIES PRESENT, SUGGEST RECOLLECTION (A)  Final   Report Status 04/23/2016 FINAL  Final  Blood culture (routine x 2)     Status: None (Preliminary result)   Collection Time: 04/21/16 10:30 AM  Result Value Ref Range Status   Specimen Description BLOOD LEFT HAND  Final   Special Requests BOTTLES DRAWN AEROBIC AND ANAEROBIC 5CC  Final   Culture NO GROWTH 2 DAYS  Final   Report Status PENDING  Incomplete  Blood culture (routine x 2)     Status: None (Preliminary result)   Collection Time: 04/21/16 10:38 AM  Result Value Ref Range Status   Specimen Description BLOOD RIGHT HAND  Final   Special Requests BOTTLES DRAWN AEROBIC AND ANAEROBIC 10CC  Final   Culture NO GROWTH 2 DAYS  Final   Report Status PENDING  Incomplete  MRSA PCR Screening  Status: None   Collection Time: 04/21/16  4:32 PM  Result Value Ref Range Status   MRSA by PCR NEGATIVE NEGATIVE Final    Comment:        The GeneXpert MRSA Assay (FDA approved for NASAL specimens only), is one component of a comprehensive MRSA colonization surveillance program. It is not intended to diagnose MRSA infection nor to guide or monitor treatment for MRSA infections. Performed at [facility]    Studies/Results: No results found. Medications: I have reviewed the patient's current medications. Scheduled Meds: . ampicillin-sulbactam (UNASYN) IV  1.5 g Intravenous Q6H  . enoxaparin (LOVENOX) injection  40 mg Subcutaneous Q24H  . potassium chloride  10 mEq Intravenous Q1 Hr x 4  . sodium chloride flush  10-40 mL Intracatheter Q12H   Continuous Infusions: . dextrose 5 % and 0.45 % NaCl with KCl 20 mEq/L 75 mL/hr at 04/23/16 1630   PRN Meds:.acetaminophen  **OR** acetaminophen, sodium chloride flush Assessment/Plan:  Sepsis secondary to Aspiration Pneumonia:  MAP 75 or higher for past 24 hours. He has remained afebrile and his acute respiratory failure has resolved. However, her failed his swallow study and is currently NPO (discussed below). Leukocytosis resolved. He is a poor candidate for antibiotics given aspiration risk and PICC is in place.  - Unasyn 1.5 q6h  for 7 day course (last dose 6/16)  Aspiration Risk: Patient will be at high risk for aspiration pneumonia, even with a puree diet, in the future, given that he failed his swallow study and is currently NPO. He is a poor candidate for a PEG tube as this will not likely increase his survival and may diminish is quality of life. I will have a discussion this afternoon with his HCPOAs (sisters) to ascertain what course of care would be consistent with [name] values. This is likely driven by his parkinsonism, and unfortunately, he would not be likely to tolerate L-DOPA orally. - Family meeting today. - Palliative Care consult  Bacturia: Large leuks on UA, negative nitrites. Urine culture shows multiple species. [name] never complained of a suprapubic tenderness or dysuria. I have a low suspicion for UTI driving his sepsis-like presentation on admission.   Hypernatremia: sodium stable at 149, given 4L bolus of NS yesterday for low blood pressure contributing to hypernatremia.  - D5 1/2NS at 75 cc/hr with 20 mEq/L KCl  Hypokalemia: K 3.0 this morning, on D5 1/2 NS w/ 20mEq.  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 Unfortunately, NPO status would prevent him from receiving oral therapies.  Vascular dementia: Can respond appropriately to yes/no questions. Oriented to self and place at baseline.  DVT PPx: Lovenox [place]  Dispo: Disposition is deferred at this time, awaiting improvement of current medical problems.   The patient does  not have a current PCP, [facility] He   does not [facility] follow[facility] appointment after discharge.  The patient does have transportation limitations that hinder transportation to clinic appointments.  .Services Needed at time of discharge: Y = Yes, Blank = No PT:   OT:   RN:   Equipment:   Other:     LOS: 3 days   Ruben ImJeremy Raahi Korber, MD 04/24/2016, 10:07 AM

## 2016-04-24 NOTE — Progress Notes (Addendum)
I spoke with [name]' sisters, Ms. Anthony Heath and Ms. Anthony Heath, who are his HCPOAs. The patient could not significantly participate in the conversation given his underlying vascular dementia.  They have been taking care of him for the past few years every since he started to decline in inpatient rehab after his stroke. This has included daily care and puree-ing his foods. They say, "He has had a long road." When I asked [name]' sisters what he liked to do before his stroke, they replied, "He liked to enjoy his family."  While they never explicitly discussed that kind of interventions they would want at the end of life, they indicated that he would not likely want intubation, chest compressions, cardioversion, pressors, or a PEG-tube. They expressed understanding that given his swallowing difficulty, he will continue to be at increased risk of aspiration and recurrent pneumonia. They are not sure the best course of action would be to return to the hospital every time that this happens. They indicated that [name] would prefer to pass at home surrounded by family. They are interested in speaking with palliative care to discuss options going forward, including, but not limited to, home hospice services. They are also interested in obtaining a suction device they can use at home.  Based on this conversation, I switched [name] from Partial Code (pressors only) to DNR, which is line with the patient's presumed wishes.

## 2016-04-24 NOTE — Plan of Care (Signed)
Problem: Education: Goal: Knowledge of [place] [facility] Education information/materials will improve Outcome: Progressing Informed patient of transfer to lower level of care

## 2016-04-24 NOTE — Progress Notes (Signed)
Speech Language Pathology Treatment: Dysphagia  Patient Details Name: Anthony Heath MRN: [id number] DOB: 1946-07-04 Today's Date: 04/24/2016 Time: [id number] SLP Time Calculation (min) (ACUTE ONLY): 23 min  Assessment / Plan / Recommendation Clinical Impression  Pt demonstrates baseline chronic, severe dysphagia. He is observed to show signs of aspiration on his secretions, which are at times copious. SLP offered trials of teaspoons of nectar thick liquids which result in immediate hard coughing. Pt was able to tolerate 4 oz of puree texture with no signs of aspiration with upright posture and careful hand feeding. Suspect timing of swallow response and frequency of swallows to manage secretions has declined with progressive deconditioning and possible Parkinson's. Recommend pt resume a Dys 1 (puree)/pudding thick liquid diet. His sister reports he has been consuming pudding thick liquids for the past few years. Discussed with RN. Will follow for tolerance. Question if pt would benefit from medication to manage secretions, or home suction equipment. Risk of aspiration will be persistently high regardless of PO intake.    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 He presented with SOB. He has a soft voice and has difficulty speaking. He was also on BIPAP when I saw him. Much of the history was obtained from discussion with resident team and chart review. His symptoms started about 3 days ago when he developed problems swallowing, and decreased his intake. There was subjective fevers and a change in urine smell. Further symptoms included a new cough and more difficulty breathing. The patient was not complaining of much. Dr. Marily LenteFord's note reports extensive discussion with the family. His functional decline seems to have started after his stroke in 2011 and has been progressively getting worse.       SLP Plan  Continue with current plan of care      Recommendations  Diet recommendations: Dysphagia 1 (puree);Pudding-thick liquid Liquids provided via: Teaspoon Medication Administration: Crushed with puree Supervision: Staff to assist with self feeding;Trained caregiver to feed patient;Full supervision/cueing for compensatory strategies Compensations: Slow rate;Small sips/bites Postural Changes and/or Swallow Maneuvers: Seated upright 90 degrees;Upright 30-60 min after meal             Oral Care Recommendations: Oral care BID Follow up Recommendations: 24 hour supervision/assistance Plan: Continue with current plan of care     GO               [facility] HealthBonnie Shaleah Nissley, MA CCC-SLP [phone]  Claudine MoutonDeBlois, Ercell Perlman Caroline 04/24/2016, 10:27 AM

## 2016-04-24 NOTE — Progress Notes (Signed)
  Date: 04/24/2016  Patient name: Anthony Heath  Medical record number: [id number]  Date of birth: 24-May-1946   This patient has been seen and the plan of care was discussed with the house staff. Please see Dr. Marily LenteFord's note for complete details. I concur with his findings.  Inez CatalinaEmily B Mullen, MD 04/24/2016, 4:08 PM

## 2016-04-25 DIAGNOSIS — Z7189 Other specified counseling: Secondary | ICD-10-CM

## 2016-04-25 DIAGNOSIS — K117 Disturbances of salivary secretion: Secondary | ICD-10-CM | POA: Insufficient documentation

## 2016-04-25 DIAGNOSIS — B9620 Unspecified Escherichia coli [E. coli] as the cause of diseases classified elsewhere: Secondary | ICD-10-CM

## 2016-04-25 DIAGNOSIS — F0150 Vascular dementia without behavioral disturbance: Secondary | ICD-10-CM

## 2016-04-25 DIAGNOSIS — E46 Unspecified protein-calorie malnutrition: Secondary | ICD-10-CM

## 2016-04-25 DIAGNOSIS — Z515 Encounter for palliative care: Secondary | ICD-10-CM

## 2016-04-25 LAB — BASIC METABOLIC PANEL
Anion gap: 5 (ref 5–15)
CHLORIDE: 117 mmol/L — AB (ref 101–111)
CO2: 22 mmol/L (ref 22–32)
Calcium: 8 mg/dL — ABNORMAL LOW (ref 8.9–10.3)
Creatinine, Ser: 0.7 mg/dL (ref 0.61–1.24)
GFR calc Af Amer: >60 mL/min (ref >60)
GFR calc non Af Amer: >60 mL/min (ref >60)
GLUCOSE: 121 mg/dL — AB (ref 65–99)
POTASSIUM: 3 mmol/L — AB (ref 3.5–5.1)
Sodium: 144 mmol/L (ref 135–145)

## 2016-04-25 LAB — BLOOD CULTURE ID PANEL (REFLEXED)
Acinetobacter baumannii: NOT DETECTED
Candida albicans: NOT DETECTED
Candida glabrata: NOT DETECTED
Candida krusei: NOT DETECTED
Candida parapsilosis: NOT DETECTED
Candida tropicalis: NOT DETECTED
Carbapenem resistance: NOT DETECTED
Enterobacter cloacae complex: NOT DETECTED
Enterobacteriaceae species: DETECTED — AB
Enterococcus species: NOT DETECTED
Escherichia coli: DETECTED — AB
Haemophilus influenzae: NOT DETECTED
Klebsiella oxytoca: NOT DETECTED
Klebsiella pneumoniae: NOT DETECTED
Listeria monocytogenes: NOT DETECTED
Methicillin resistance: NOT DETECTED
Neisseria meningitidis: NOT DETECTED
Proteus species: NOT DETECTED
Pseudomonas aeruginosa: NOT DETECTED
Serratia marcescens: NOT DETECTED
Staphylococcus aureus (BCID): NOT DETECTED
Staphylococcus species: NOT DETECTED
Streptococcus agalactiae: NOT DETECTED
Streptococcus pneumoniae: NOT DETECTED
Streptococcus pyogenes: NOT DETECTED
Streptococcus species: NOT DETECTED
Vancomycin resistance: NOT DETECTED

## 2016-04-25 MED ORDER — CEFDINIR 125 MG/5ML PO SUSR
300.0000 mg | Freq: Two times a day (BID) | ORAL | Status: DC
  Administered 2016-04-25: 300 mg via ORAL
  Filled 2016-04-25 (×2): qty 15

## 2016-04-25 MED ORDER — DEXTROSE 5 % IV SOLN: 2.0000 g | INTRAVENOUS | Status: DC

## 2016-04-25 MED ORDER — KCL IN DEXTROSE-NACL 40-5-0.45 MEQ/L-%-% IV SOLN
INTRAVENOUS | Status: DC
  Administered 2016-04-25: 08:00:00 via INTRAVENOUS
  Filled 2016-04-25 (×2): qty 1000

## 2016-04-25 MED ORDER — POTASSIUM CHLORIDE 10 MEQ/100ML IV SOLN
10.0000 meq | INTRAVENOUS | Status: AC
  Administered 2016-04-25 (×4): 10 meq via INTRAVENOUS
  Filled 2016-04-25 (×3): qty 100

## 2016-04-25 MED ORDER — CEFDINIR 125 MG/5ML PO SUSR: 300.0000 mg | Freq: Two times a day (BID) | ORAL | Status: AC

## 2016-04-25 MED ORDER — CARBIDOPA-LEVODOPA 10-100 MG PO TABS: 2.0000 | ORAL_TABLET | Freq: Three times a day (TID) | ORAL | Status: AC

## 2016-04-25 MED ORDER — AMOXICILLIN-POT CLAVULANATE 250-62.5 MG/5ML PO SUSR: 750.0000 mg | Freq: Two times a day (BID) | ORAL | Status: AC

## 2016-04-25 MED ORDER — DEXTROSE 5 % IV SOLN
2.0000 g | INTRAVENOUS | Status: DC
  Administered 2016-04-25: 2 g via INTRAVENOUS
  Filled 2016-04-25: qty 2

## 2016-04-25 MED ORDER — CARBIDOPA-LEVODOPA 10-100 MG PO TABS
2.0000 | ORAL_TABLET | Freq: Three times a day (TID) | ORAL | Status: DC
  Administered 2016-04-25 (×2): 2 via ORAL
  Filled 2016-04-25 (×2): qty 2

## 2016-04-25 MED ORDER — RESOURCE THICKENUP CLEAR PO POWD: 1.0000 | ORAL | Status: AC | PRN

## 2016-04-25 NOTE — Discharge Summary (Signed)
Name: Anthony Heath MRN: [id number] DOB: 02-25-1946 70 y.o. PCP: Reuben Likes, MD  Date of Admission: 04/21/2016  9:58 AM Date of Discharge: 04/25/2016 Attending Physician: Inez Catalina, MD  Discharge Diagnosis: 1. Sepsis secondary to UTI 2. E. Coli Bacteremia 3. Aspiration Pneumonia 4. Vascular Dementia 5. Vascular Parkinsonism 6. Hypernatremia 7. Hypokalemia 8: Right-Sided Hemiparesis 9. History of CVA 10. Protein Calorie Malnutrition  Discharge Medications:   Medication List    STOP taking these medications        aspirin EC 81 MG tablet     atorvastatin 80 MG tablet  Commonly known as:  LIPITOR      TAKE these medications        acetaminophen 325 MG tablet  Commonly known as:  TYLENOL  Take 650 mg by mouth every 6 (six) hours as needed for mild pain or fever.     amoxicillin-clavulanate 250-62.5 MG/5ML suspension  Commonly known as:  AUGMENTIN  Take 15 mLs (750 mg total) by mouth 2 (two) times daily.     carbidopa-levodopa 10-100 MG tablet  Commonly known as:  SINEMET IR  Take 2 tablets by mouth 3 (three) times daily.     cefdinir 125 MG/5ML suspension  Commonly known as:  OMNICEF  Take 12 mLs (300 mg total) by mouth 2 (two) times daily. Last dose 05/01/16.     docusate sodium 100 MG capsule  Commonly known as:  COLACE  Take 200 mg by mouth daily.     latanoprost 0.005 % ophthalmic solution  Commonly known as:  XALATAN  Place 1 drop into both eyes at bedtime.     omeprazole 20 MG capsule  Commonly known as:  PRILOSEC  Take 40 mg by mouth daily.     polyvinyl alcohol 1.4 % ophthalmic solution  Commonly known as:  LIQUIFILM TEARS  Place 1-2 drops into both eyes daily as needed for dry eyes.     RESOURCE THICKENUP CLEAR Powd  Take 120 g by mouth as needed.     senna 8.6 MG Tabs tablet  Commonly known as:  SENOKOT  Take 1-2 tablets by mouth daily as needed for mild constipation.        Disposition and follow-up:   Mr.Anthony Heath was  discharged from [facility] in stable condition.  At the hospital follow up visit please address:  1.  Patient to continue Cefdinir until January 20th. Patient does not complain of UTI symptoms and presented with Sepsis and E. Coli bacteremia. Please assess for adherence.  2. Patient has parkinsonism, most likely vascular in etiology. Please assess for improvement in rigidity and tremor. Can be titrated up to maximum dose as tolerated.  3. Assess if patient is comfortable is satisfied with hospice services  4. Consider starting patient on dronabinol if he is not enjoying his food.  5.  Labs / imaging needed at time of follow-up: None  6.  Pending labs/ test needing follow-up: Sensitivities of Blood Culture.  Follow-up Appointments: Follow-up Information    Follow up with Hyacinth Meeker, MD. Go on 05/01/2016.   Specialty:  Internal Medicine   Why:  at 10:15 AM.   Contact information:   [address] [place] [address] [phone]       Discharge Instructions: Discharge Instructions    DME Hospital bed    Complete by:  As directed   Patient has (list medical condition):  Vascular dementia, Vascular parkinsonism  Bed type:  Semi-electric  For home use only DME Suction    Complete by:  As directed   Suction:  Oral          [name],  It was a pleasure taking care of you in the hospital.  You were in the hospital for an infection in your blood. Based on our results, this was more likely from a urinary tract infection (UTI) rather than a pneumonia from difficulty swallowing. He will continue to need antibiotics by mouth until June 20th. This will be a liquid given by mouth (Cefdinir).   You were also found to have a pneumonia, but the bacteria was not in your blood. You will take another antibiotic for two more days, which will also be given by mouth (Augmentin).  We are trying you on a new medication to treat you shaking and rigidity. You will take  Sinemet 10-100 two tablets three times a day. This may help with your swallowing too. Please follow up in our clinic at the day and time shown above to see if this is helping.  Again, it was a joy taking care of [name], and we'll see you in clinic.   Consultations: Palliative Care  Procedures Performed:  Ct Head Wo Contrast  04/21/2016  CLINICAL DATA:  70 year old male with a history of new right-sided deficits EXAM: CT HEAD WITHOUT CONTRAST TECHNIQUE: Contiguous axial images were obtained from the base of the skull through the vertex without intravenous contrast. COMPARISON:  07/19/2014 FINDINGS: Unremarkable appearance of the calvarium without acute fracture or aggressive lesion. Unremarkable appearance of the scalp soft tissues. Unremarkable appearance of the bilateral orbits. Debris within the bilateral external auditory canal. Mastoid air cells are clear. No significant paranasal sinus disease No acute intracranial hemorrhage.  No midline shift or mass effect. Similar appearance of diffuse brain volume loss and expansion of the ventricles. Confluent hypodensity in the bilateral periventricular white matter is unchanged. Intracranial atherosclerosis. IMPRESSION: No CT evidence of acute intracranial abnormality. Similar appearance of chronic white matter disease and intracranial atherosclerosis. Brain volume loss, similar to prior. Signed, Yvone NeuJaime S. Loreta AveWagner, DO [facility] Electronically Signed   By: Gilmer MorJaime  Wagner D.O.   On: 04/21/2016 13:44   Ct Angio Chest Pe W/cm &/or Wo Cm  04/21/2016  CLINICAL DATA:  Hypoxia. EXAM: CT ANGIOGRAPHY CHEST WITH CONTRAST TECHNIQUE: Multidetector CT imaging of the chest was performed using the standard protocol during bolus administration of intravenous contrast. Multiplanar CT image reconstructions and MIPs were obtained to evaluate the vascular anatomy. CONTRAST:  70 cc Isovue 370 COMPARISON:  Portable chest  obtained earlier today. FINDINGS: Mediastinum/Lymph Nodes: No pulmonary emboli or thoracic aortic dissection identified. No masses or pathologically enlarged lymph nodes identified. Lungs/Pleura: Interval extensive, multifocal, patchy opacity in both lungs. This is most pronounced and most confluent in the right perihilar region, involving all 3 lobes, most pronounced involving the right lower lobe. No pleural fluid. Upper abdomen: No acute findings. Musculoskeletal: Thoracic and lower cervical spine degenerative changes. Review of the MIP images confirms the above findings. IMPRESSION: 1. Interval extensive patchy, multifocal and confluent opacity both lungs, most pronounced in the right perihilar region and right lower lobe. This is compatible with interval extensive multilobar pneumonia. This could be due to septic emboli or atypical pneumonia. 2. Normally opacified pulmonary arteries with no pulmonary emboli seen. Electronically Signed   By: Beckie SaltsSteven  Reid M.D.   On: 04/21/2016 22:48   Dg Chest Port 1 View  04/22/2016  CLINICAL DATA:  Sepsis EXAM: PORTABLE CHEST 1 VIEW COMPARISON:  Plain film and chest CT of 1 day prior FINDINGS: Midline trachea. Normal heart size. No pleural effusion or pneumothorax. Since the prior plain film, increase in multifocal, right greater than left airspace opacities. The most confluent area is in the right perihilar region. A skin fold projects the left hemi thorax. IMPRESSION: Progressive multifocal airspace opacities, most consistent with pneumonia. Electronically Signed   By: Jeronimo Greaves M.D.   On: 04/22/2016 07:18   Dg Chest Portable 1 View  04/21/2016  CLINICAL DATA:  70 year old male with a history of altered mental status EXAM: PORTABLE CHEST 1 VIEW COMPARISON:  04/10/2016 FINDINGS: Right rotation. Cardiomediastinal silhouette unchanged. No confluent airspace disease pneumothorax or pleural effusion. Coarsened interstitial markings similar to prior. No displaced fracture.  IMPRESSION: Chronic lung changes without evidence of superimposed acute cardiopulmonary disease. Signed, Yvone Neu. Loreta Ave, DO [facility] Electronically Signed   By: Gilmer Mor D.O.   On: 04/21/2016 11:07   Dg Chest Portable 1 View  04/10/2016  CLINICAL DATA:  Acute onset of cough.  Initial encounter. EXAM: PORTABLE CHEST 1 VIEW COMPARISON:  Chest radiograph performed 07/19/2014 FINDINGS: The lungs are well-aerated. Pulmonary vascularity is at the upper limits of normal. Mild left midlung opacity may reflect atelectasis. There is no evidence of pleural effusion or pneumothorax. The cardiomediastinal silhouette is within normal limits. No acute osseous abnormalities are seen. IMPRESSION: Mild left midlung opacity may reflect atelectasis. Lungs otherwise clear. Electronically Signed   By: Roanna Raider M.D.   On: 04/10/2016 23:17   Admission HPI: Mr. 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 The patient had difficulty communicating (baseline), but his sister, Horris Latino [facility]) was at bedside to relay the history. The patient lives with his other sister, Ruel Favors, also an HCPOA, who was not present at the time.  Kendal Hymen says Mr. Arshad "never complains of anything," but did notice that he started to have difficulty swallowing three days ago, and therefore, has had minimal liquid intake. He typically can only eat food if it is pureed, but now he has been having difficulty managing soft foods. They've also noted that his urine smelled of "ammonia," but did not appear cloudy. The sister Ruel Favors had noted that he appeared "feverish." They've also noticed that he had had more difficulty breathing and has been having a "rattling cough."He continued to worsen, so they sought medical attention. To their  knowledge, he has not had any chest pain, dysuria, worsening confusion, new one-sided weakness.   He was seen in the ED on 5/30 for a productive cough without fever. There was concern for aspiration at that time, but CXR showed only atelectasis on left mid-lung.  The sister reports that the patient is minimally conversant at home but may have a "back and forth" with the sisters as he watches TV. He apparently used to watch sports, but now will watch whatever is one. He is non-ambulatory. After his stroke, he went to rehab for a time, but he continued to worsen. Looking back at his discharge summary from 2011 when he had his CVA, it does not seem he had the left-sided tremor at that time. He was on a nectar-consistency diet. Kendal Hymen indicates that he has never been on L-DOPA or drugs to treat parkinsonism. At baseline,  he is oriented to self and place, but not the year. They've noted a steady decline in his quality of life since 2011. He [facility] all his [facility] from the [facility] and does not apparently follow with a neurologist. He is a former smoker with a previous 30 pack-year history. The sister says there is a family history T2DM and cancer.  Sister Kendal Hymen and I spoke extensively about Code Status, she believe it would be his wishes to forego intubation, chest compressions, and cardioversion. She is supportive of this plan.   In the ED, he was noted to have large leuks, negative nitrites on UA. He was started on vancomycin and zosyn. Urine cultures were not obtained, but blood cultures were obtained. WBCs to 17.9 (NAb 12.7) with T98.1. Due to his increased work of breathing, an ABG was obtained which demonstrated pO2 64; pCO2 54; pH 7.261; HCO3 24.3, %O2 Sat 88. He was placed on BiPAP thereafter. He was noted to be hypernatremic to 153 and hypokalemic to 5.6, with EKG showing sinus tachycardia without peaked T waves. He was bolused 2L NS. AP CXR Chronic lung changes without evidence of superimposed  acute cardiopulmonary disease.  Hospital Course by problem list:  Sepsis secondary to UTI: Patient was found to have large leukocytes on UA. However, given multifocal pneumonia seen on CT chest, it was presumed that his sepsis was likely due to aspiration pneumonia. He was treated initially with vancomycin and zosyn, which was narrowed to zosyn. His leukocytosis and lactic acidosis resolved. He remained afebrile. On day of discharge, we were called regarding that his blood cultures grew E. Coli, which would be inconsistent with an aspiration pneumonia. He was transitioned to IV ceftriaxone (PICC line removed as he had difficult and transitioned to cefdinir suspension for a total 10 day course on discharge (last dose January 20th).   Aspiration Pneumonia with Hypercarbic Respiratory Failure: Patient has worsening aspiration risk, likely due to parkinsonism. This likely led to multifocal pneumonia, predominately in the right lung, seen on CT chest (obtained in setting of Wells 3 with elevated D-dimer). His respiratory status improved and acute respiratory acidosis resolved on BiPAP and antibiotics. He was noted to have difficulty clearing his secretions throughout the hospitalization, but he was advanced from NPO to a dysphagia 1 diet. It as noted that perhaps his swallowing improved on Sinemet by his sisters. He was discharged on two additional days of Augmentin suspension to complete a 7 day course.  Goals of Care: Multiple discussions were held with the patients HCPOAs (his sisters Ms. Staples and Ms. Delane Ginger). It was determined that he would not likely want intubation, chest compressions, cardioversion, pressors, or a PEG-tube. His Code Status was accordingly changed to DNR/DNI.  Vascular Parkinsonism: Patient was started on carbidopa-levodopa 10-100 TID. It was noted by the team and his sisters that his rigidity and tremor had improved, albeit with plenty of room for improvement. This was increased to  20-200 TID on discharge. Of note, if this is truly vascular parkinsonism, it may not be extensively responsive to Sinemet.  Vascular Dementia: Patient is minimally verbal and primarily answers yes or no questions. He is oriented to self and place at baseline.  Hypernatremia: Patient's hypernatremia resolved on D5 1/2 NS and was 144 on discharge.  Hypokalemia: Patient had persistent hypokalemia in the 3.0 range, likely due to poor oral intake.   Discharge Vitals:   BP 107/52 mmHg  Pulse 89  Temp(Src) 99 F (37.2 C) (Axillary)  Resp 18  Ht 5\' 9"  (1.753 m)  Wt 125 lb 12.8 oz (57.063 kg)  BMI 18.57 kg/m2  SpO2 100%  Discharge Labs:  Results for orders placed or performed during the hospital encounter of 04/21/16 (from the past 24 hour(s))  Basic metabolic panel     Status: Abnormal   Collection Time: 04/25/16  4:30 AM  Result Value Ref Range   Sodium 144 135 - 145 mmol/L   Potassium 3.0 (L) 3.5 - 5.1 mmol/L   Chloride 117 (H) 101 - 111 mmol/L   CO2 22 22 - 32 mmol/L   Glucose, Bld 121 (H) 65 - 99 mg/dL   BUN <5 (L) 6 - 20 mg/dL   Creatinine, Ser 1.61 0.61 - 1.24 mg/dL   Calcium 8.0 (L) 8.9 - 10.3 mg/dL   GFR calc non Af Amer >60 >60 mL/min   GFR calc Af Amer >60 >60 mL/min   Anion gap 5 5 - 15    Signed: Ruben Im, MD 04/25/2016, 1:41 PM    Services Ordered on Discharge: Home Hospice Equipment Ordered on Discharge: Hospital Bed, Suction Device

## 2016-04-25 NOTE — Progress Notes (Signed)
Notified by Ronny FlurryHeather [name], CMRN of family request for Hospice and Palliative Care of  services at home after discharge. Chart and patient information currently under review to confirm hospice eligibility.   Spoke with the patient's sister Sabra by phone  to initiate education related to hospice philosophy, services and team approach to care. Family verbalized understanding of the information provided. Per discussion plan is for discharge to home via [facility] later today after suction machine can be delivered.   Please send signed completed DNR form home with patient.   Patient will need prescriptions for discharge comfort medications.   DME needs discussed and family has requested a suction machine. Sister stated that they already have a hospital bed, w/c, and hoyer lift in the home.    [facility] equipment manager Jewel Kizzie BaneHughes notified and will contact [facility] to arrange delivery to the home.  The home address has been verified and is correct in the chart.   Sister Myra GianottiSabra Staples is the family member to be contacted to arrange time of delivery.   [facility] aware of the above.   Completed discharge [facility] [phone] when final.  Please notify [facility] when patient is ready to leave unit at discharge-call [phone].  [facility] information and contact numbers have been given to the sister. Above information shared with Ronny FlurryHeather [name],  CMRN.  Please call with any questions.    Lavone NeriSusan Pat Elicker, RN [facility]  [phone]

## 2016-04-25 NOTE — Progress Notes (Signed)
Nutrition Brief Note  Chart reviewed. Case discussed with RN.  Pt now transitioning to comfort care.  No further nutrition interventions warranted at this time.  Please re-consult as needed.   Chimaobi Casebolt A. Kannan Proia, RD, LDN, CDE Pager: [phone] After hours Pager: [phone]  

## 2016-04-25 NOTE — Clinical Social Work Note (Signed)
CSW received referral for patient's family wanting hospice at home, case management arranges hospice at home and has been notified, CSW to sign off.  Ervin KnackEric R. Keyuana Wank, MSW, [employer]CSWA [phone]

## 2016-04-25 NOTE — Progress Notes (Signed)
Patient discharged to home with hospice via [facility]. 

## 2016-04-25 NOTE — Progress Notes (Signed)
  PHARMACY - PHYSICIAN COMMUNICATION CRITICAL VALUE ALERT - BLOOD CULTURE IDENTIFICATION (BCID)  Results for orders placed or performed during the hospital encounter of 04/21/16  Blood Culture ID Panel (Reflexed) (Collected: 04/21/2016 10:30 AM)  Result Value Ref Range   Enterococcus species NOT DETECTED NOT DETECTED   Vancomycin resistance NOT DETECTED NOT DETECTED   Listeria monocytogenes NOT DETECTED NOT DETECTED   Staphylococcus species NOT DETECTED NOT DETECTED   Staphylococcus aureus NOT DETECTED NOT DETECTED   Methicillin resistance NOT DETECTED NOT DETECTED   Streptococcus species NOT DETECTED NOT DETECTED   Streptococcus agalactiae NOT DETECTED NOT DETECTED   Streptococcus pneumoniae NOT DETECTED NOT DETECTED   Streptococcus pyogenes NOT DETECTED NOT DETECTED   Acinetobacter baumannii NOT DETECTED NOT DETECTED   Enterobacteriaceae species DETECTED (A) NOT DETECTED   Enterobacter cloacae complex NOT DETECTED NOT DETECTED   Escherichia coli DETECTED (A) NOT DETECTED   Klebsiella oxytoca NOT DETECTED NOT DETECTED   Klebsiella pneumoniae NOT DETECTED NOT DETECTED   Proteus species NOT DETECTED NOT DETECTED   Serratia marcescens NOT DETECTED NOT DETECTED   Carbapenem resistance NOT DETECTED NOT DETECTED   Haemophilus influenzae NOT DETECTED NOT DETECTED   Neisseria meningitidis NOT DETECTED NOT DETECTED   Pseudomonas aeruginosa NOT DETECTED NOT DETECTED   Candida albicans NOT DETECTED NOT DETECTED   Candida glabrata NOT DETECTED NOT DETECTED   Candida krusei NOT DETECTED NOT DETECTED   Candida parapsilosis NOT DETECTED NOT DETECTED   Candida tropicalis NOT DETECTED NOT DETECTED    Name of physician (or Provider) Contacted: Dr. Ruben ImJeremy Ford  Changes to prescribed antibiotics required: Pt on D#5 Unasyn for aspiration pneumonia.  In discussion with Dr. Ala DachFord, will continue the Unasyn to complete a 7 day course for aspiration pneumonia then transition to ceftriaxone for the  remainder 3-7 days to complete a course for E coli bacteremia of 10-14 days.   FYI - the Enterobacteriaceae species detected is the group of organisms that E coli, Klebsiella, Enterobacter, Proteus, and Serratia belong to. The only organism detected on BCID is E coli.  Thanks!  Cassie L. Roseanne RenoStewart, PharmD PGY2 Infectious Diseases Pharmacy Resident Pager: [phone] 04/25/2016 9:22 AM

## 2016-04-25 NOTE — Consult Note (Signed)
Consultation Note Date: 04/25/2016   Patient Name: Anthony Heath  DOB: [birth year]  MRN: [id number]  Age / Sex: 70 y.o., male  PCP: Harden Mo, MD Referring Physician: Sid Falcon, MD  Reason for Consultation: Establishing goals of care, Hospice Evaluation and Inpatient hospice referral  HPI/Patient Profile: 70 y.o. male  with past medical history of vascular dementia, CVA, right hemiparesis admitted on 04/21/2016 with sepsis and acute respiratory failure likely due to aspiration.  He has been treated with antibiotics and supportive therapy.  Swallow evaluation indicates severe aspiration.  Patient is NPO.Marland Kitchen   Clinical Assessment and Goals of Care: Patient is non verbal and does not interact with me.  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   I met with his sisters at bedside.  They (primarily Ms. Staples) have cared for the patient in their home for the past 5 years.   They are extremely reasonable and want their brother to be comfortable.  We discussed recurrent aspiration pneumonia.  We discussed end of life and the fact that he will gradually sleep more and more and interact less.  He will eat less.  This is natural.  Do not overfeed him.  Let him eat for taste and pleasure as he wants to eat.  He is no longer eating for nutrition.  His sisters understand this.  We discussed hospice services.  They initially want services in their home, but would like him transferred to [facility] for end of life.  They would like suction equipment.  They do not want artificial feeding or PEG tube.  We discussed recurrent infections and the consideration of not using antibiotics unless it is for comfort.  We discussed not returning to the hospital unless his symptoms can not be managed by hospice at home.   I told them at this point my best guess is about a 2 month prognosis.  If he  does not wake to eat and drink it will be less.  NEXT OF KIN:  Ms. Jodell Cipro and Ms. Artis Delay are the decision makers.    [place] in the Home.   Suction equipment and I'm unsure if they have a Hospital bed [facility] for end of life. ([facility])  Code Status/Advance Care Planning:  DNR    Symptom Management:   Suction PRN  Palliative Prophylaxis:   Aspiration  Additional Recommendations (Limitations, Scope, Preferences):  Avoid Hospitalization, Full Comfort Care and Minimize Medications  Psycho-social/Spiritual:   Desire for further Chaplaincy support:no  Additional Recommendations: Caregiving  Support/Resources  Prognosis:   < 6 weeks.  Given end stage dementia, severe aspiration with recurrent infections, bed bound, incontinent, no longer speaks, no longer smiles.  Discharge Planning: [facility] with [facility]      Primary Diagnoses: Present on Admission:  . Sepsis ([place])  I have reviewed the medical record, interviewed the patient and family, and examined the patient. The following aspects are pertinent.  Past Medical History  Diagnosis Date  . Stroke [facility])    Social History   Social History  . Marital Status: Divorced    Spouse Name: N/A  . Number of Children: N/A  . Years of Education: N/A   Social History Main Topics  . Smoking status: Never Smoker   . Smokeless tobacco: None  . Alcohol Use: No  . Drug Use: No  . Sexual Activity: Not Asked   Other Topics Concern  . None   Social History Narrative   No family history on file. Scheduled Meds: . carbidopa-levodopa  1 tablet Oral TID  . cefTRIAXone (ROCEPHIN)  IV  2 g Intravenous Q24H  . enoxaparin (LOVENOX) injection  40 mg Subcutaneous Q24H  . potassium chloride  10 mEq Intravenous Q1 Hr x 4  . sodium chloride flush  10-40 mL Intracatheter Q12H   Continuous Infusions: . dextrose 5 % and 0.45 % NaCl with KCl 40 mEq/L 75 mL/hr at 04/25/16 0751    PRN Meds:.acetaminophen **OR** acetaminophen, RESOURCE THICKENUP CLEAR, sodium chloride flush Medications Prior to Admission:  Prior to Admission medications   Medication Sig Start Date End Date Taking? Authorizing Provider  acetaminophen (TYLENOL) 325 MG tablet Take 650 mg by mouth every 6 (six) hours as needed for mild pain or fever.   Yes Historical Provider, MD  aspirin EC 81 MG tablet Take 81 mg by mouth every morning.   Yes Historical Provider, MD  atorvastatin (LIPITOR) 80 MG tablet Take 40 mg by mouth daily.  04/20/14  Yes Historical Provider, MD  docusate sodium (COLACE) 100 MG capsule Take 200 mg by mouth daily.   Yes Historical Provider, MD  latanoprost (XALATAN) 0.005 % ophthalmic solution Place 1 drop into both eyes at bedtime. 04/06/16  Yes Historical Provider, MD  omeprazole (PRILOSEC) 20 MG capsule Take 40 mg by mouth daily.  06/11/14  Yes Historical Provider, MD  senna (SENOKOT) 8.6 MG TABS tablet Take 1-2 tablets by mouth daily as needed for mild constipation.   Yes Historical Provider, MD  polyvinyl alcohol (LIQUIFILM TEARS) 1.4 % ophthalmic solution Place 1-2 drops into both eyes daily as needed for dry eyes.    Historical Provider, MD   No Known Allergies Review of Systems:  Patient is unable.  Physical Exam  Wd male eyes closed.  Does not speak, but will moan.  Would inspire on command CV RRR Resp:  NAD Abdomen:  Thin, NT Ext.  Large tremor movement of left arm and left leg. Appears unable to move right leg.  Vital Signs: BP 107/52 mmHg  Pulse 89  Temp(Src) 99 F (37.2 C) (Axillary)  Resp 18  Ht '5\' 9"'  (1.753 m)  Wt 57.063 kg (125 lb 12.8 oz)  BMI 18.57 kg/m2  SpO2 100% Pain Assessment: Faces   Pain Score: 2    SpO2: SpO2: 100 % O2 Device:SpO2: 100 % O2 Flow Rate: .O2 Flow Rate (L/min): 3 L/min  IO: Intake/output summary:  Intake/Output Summary (Last 24 hours) at 04/25/16 0937 Last data filed at 04/25/16 0857  Gross per 24 hour  Intake     40 ml   Output   3100 ml  Net  -3060 ml    LBM: Last BM Date: 04/21/16 Baseline Weight: Weight: 65.7 kg (144 lb 13.5 oz) Most recent weight: Weight: 57.063 kg (125 lb 12.8 oz)     Palliative Assessment/Data:   Flowsheet Rows        Most Recent Value   Intake Tab  Referral Department  -- [facility]]   Unit at Time of Referral  Med/Surg Unit   Palliative Care Primary Diagnosis  Neurology   Date Notified  04/23/16   Palliative Care Type  New Palliative care   Reason for referral  Clarify Goals of Care   Date of Admission  04/21/16   Date first seen by Palliative Care  04/24/16   # of days Palliative referral response time  1 Day(s)   # of days IP prior to Palliative referral  2   Clinical Assessment    Palliative Performance Scale Score  10%   Psychosocial & Spiritual Assessment    Palliative Care Outcomes    Patient/Family meeting held?  Yes   Who was at the meeting?  Two sisters and patient.      Time In: 9:15 Time Out: 10:30 Time Total: 75 Greater than 50%  of this time was spent counseling and coordinating care related to the above assessment and plan.  Imogene Burn, PA-C Palliative Medicine Pager: [phone]   Please contact Palliative Medicine Team phone at [phone] for questions and concerns.  For individual provider: See Shea Evans

## 2016-04-25 NOTE — Care Management Note (Signed)
Case Management Note  Patient Details  Name: Anthony Heath: [id number] Date of Birth: [birth year]  Subjective/Objective:                    Action/Plan:  Spoke with Dr Ala DachFord , plan is to DC PICC and discharge patient home on PO antibiotics with home hospice with suction and hospital bed .   Have called both sisters to discuss home with hospice Ms Cherlynn PoloStaples phone has no voice mail option . Left message with [name] awaiting call back. Expected Discharge Date:  04/26/16               Expected Discharge Plan:     In-House Referral:     Discharge planning Services     Post Acute Care Choice:    Choice offered to:     DME Arranged:    DME Agency:     HH Arranged:    HH Agency:     Status of Service:     Medicare Important Message Given:    Date Medicare IM Given:    Medicare IM give by:    Date Additional Medicare IM Given:    Additional Medicare Important Message give by:     If discussed at Long Length of Stay Meetings, dates discussed:    Additional Comments:  [name], [name] Marie, RN 04/25/2016, 11:50 AM

## 2016-04-25 NOTE — Discharge Instructions (Signed)
Anthony Heath,  It was a pleasure taking care of you in the hospital.  You were in the hospital for an infection in your blood. Based on our results, this was more likely from a urinary tract infection (UTI) rather than a pneumonia from difficulty swallowing. He will continue to need antibiotics by mouth until June 20th. This will be a liquid given by mouth (Cefdinir).   You were also found to have a pneumonia, but the bacteria was not in your blood. You will take another antibiotic for two more days, which will also be given by mouth (Augmentin).  We are trying you on a new medication to treat you shaking and rigidity. You will take Sinemet 10-100 two tablets three times a day. This may help with your swallowing too. Please follow up in our clinic at the day and time shown above to see if this is helping.  Again, it was a joy taking care of Anthony Heath, and we'll see you in clinic.

## 2016-04-25 NOTE — Care Management Note (Addendum)
Case Management Note  Patient Details  Name: Anthony Heath MRN: [id number] Date of Birth: 03/10/1946  Subjective/Objective:                    Action/Plan:  Reached patient's sister Anthony Heath [phone] and sister Anthony Heath [phone] .  [name] is in agreement for patient to discharge to home with hospice but has deferred all decision making to Ms Anthony Heath.   [facility] .   Offered choice, Ms Anthony Heath would like [facility].  Explained patient will be discharging on PO antibiotics and not IV antibiotics and PICC line has been removal. Ms Anthony Heath voiced understanding.   Patient already has a hospital bed at home. Explained MD ordered  suction machine. Ms Anthony Heath voiced understanding.  Ms Anthony Heath requesting ambulance transportation home at discharge.   [facility]  [name] stated they are changing to a [facility] MD next week but unsure of MD's name.  [facility].  Ms Anthony Heath aware patient discharging to home today . [name] with [facility] spoke with Ms Anthony Heath vis phone . [name] will order suction machine for home to be delivered today , once delivered Ms Anthony Heath will call bedside nurse Anthony Heath [phone] to let her know suction machine was delivered , then Anthony Heath will Call [facility] to transport patient home. Ambulance paperwork completed and placed in patient's shadow drawer with [facility] numbers . Everyone aware and in agreement. Anthony Heath paged Dr Ala DachFord to sign DNR form. Expected Discharge Date:  04/26/16               Expected Discharge Plan:  Home w Hospice Care  In-House Referral:     Discharge planning Services     Post Acute Care Choice:    Choice offered to:  [name] / Guardian, Sibling  DME Arranged:  Suction DME Agency:  [facility]  [facility] Agency:  [facility]  Status of Service:   In process, will continue to follow  Medicare Important Message Given:    Date Medicare IM Given:    Medicare IM give by:    Date Additional Medicare IM Given:    Additional Medicare Important Message give by:     If discussed at Long Length of Stay Meetings, dates discussed:    Additional Comments:  [name], [name], RN 04/25/2016, 1:55 PM

## 2016-04-25 NOTE — Progress Notes (Signed)
Subjective: [name] nods is head "yes," indicating he is feeling well this morning. He has no complaints. He nods his head "yes" when asked if he thinks the new medication, Sinemet helped with his shaking and rigidity.  Objective: Vital signs in last 24 hours: Filed Vitals:   04/24/16 0617 04/24/16 1351 04/24/16 2211 04/25/16 0457  BP: 131/64 113/84 109/56 107/52  Pulse: 58 74 81 89  Temp: 98.6 F (37 C) 98 F (36.7 C) 98.9 F (37.2 C) 99 F (37.2 C)  TempSrc: Oral Oral Axillary Axillary  Resp: 20 20 19 18   Height:      Weight:      SpO2: 100% 100% 100% 100%   Weight change:   Intake/Output Summary (Last 24 hours) at 04/25/16 0840 Last data filed at 04/25/16 0457  Gross per 24 hour  Intake     30 ml  Output   3100 ml  Net  -3070 ml   Physical Exam General: Cachectic male. Lying in bed, nods to yes and no questions.  Cardiovascular: RRR, no m/r/g Pulmonary: Upper respiratory sounds, otherwise CTAB. Breathing unlabored Abdominal: Soft, normal bowel sounds Skin: PICC site c/d/i Extremities: Warm and Dry Neurological: Rigidity somewhat improved, waxing and waning left upper extremity resting tremor  Lab Results: Basic Metabolic Panel:  Recent Labs Lab 04/22/16 0727  04/24/16 0530 04/25/16 0430  NA 154*  < > 149* 144  K 2.9*  < > 3.0* 3.0*  CL 126*  < > 122* 117*  CO2 22  < > 22 22  GLUCOSE 121*  < > 93 121*  BUN 10  < > <5* <5*  CREATININE 0.79  < > 0.75 0.70  CALCIUM 8.4*  < > 8.0* 8.0*  MG 1.9  --   --   --   < > = values in this interval not displayed. CBC:  Recent Labs Lab 04/21/16 1055 04/22/16 0727 04/23/16 0835  WBC 17.9* 11.3* 9.9  NEUTROABS 12.7*  --   --   HGB 13.0 10.2* 10.0*  HCT 41.8 33.5* 32.0*  MCV 91.7 91.3 88.6  PLT 198 159 165   CBG:  Recent Labs Lab 04/22/16 0839 04/22/16 1220 04/22/16 1644  GLUCAP 98 274* 95    Urinalysis:  Recent Labs Lab 04/21/16 1019  COLORURINE YELLOW  LABSPEC 1.014  PHURINE 6.0    GLUCOSEU NEGATIVE  HGBUR MODERATE*  BILIRUBINUR NEGATIVE  KETONESUR NEGATIVE  PROTEINUR NEGATIVE  NITRITE NEGATIVE  LEUKOCYTESUR LARGE*    Micro Results: Recent Results (from the past 240 hour(s))  Culture, Urine     Status: Abnormal   Collection Time: 04/21/16 10:19 AM  Result Value Ref Range Status   Specimen Description URINE, RANDOM  Final   Special Requests ADDED 2203  Final   Culture MULTIPLE SPECIES PRESENT, SUGGEST RECOLLECTION (A)  Final   Report Status 04/23/2016 FINAL  Final  Blood culture (routine x 2)     Status: None (Preliminary result)   Collection Time: 04/21/16 10:30 AM  Result Value Ref Range Status   Specimen Description BLOOD LEFT HAND  Final   Special Requests BOTTLES DRAWN AEROBIC AND ANAEROBIC 5CC  Final   Culture  Setup Time   Final    GRAM NEGATIVE RODS ANAEROBIC BOTTLE ONLY Organism ID to follow    Culture GRAM NEGATIVE RODS  Final   Report Status PENDING  Incomplete  Blood culture (routine x 2)     Status: None (Preliminary result)   Collection Time: 04/21/16 10:38  AM  Result Value Ref Range Status   Specimen Description BLOOD RIGHT HAND  Final   Special Requests BOTTLES DRAWN AEROBIC AND ANAEROBIC 10CC  Final   Culture NO GROWTH 3 DAYS  Final   Report Status PENDING  Incomplete  MRSA PCR Screening     Status: None   Collection Time: 04/21/16  4:32 PM  Result Value Ref Range Status   MRSA by PCR NEGATIVE NEGATIVE Final    Comment:        The GeneXpert MRSA Assay (FDA approved for NASAL specimens only), is one component of a comprehensive MRSA colonization surveillance program. It is not intended to diagnose MRSA infection nor to guide or monitor treatment for MRSA infections. Performed at [facility] of [place]    Studies/Results: No results found. Medications: I have reviewed the patient's current medications. Scheduled Meds: . ampicillin-sulbactam (UNASYN) IV  1.5 g Intravenous Q6H  . carbidopa-levodopa  1 tablet Oral  TID  . enoxaparin (LOVENOX) injection  40 mg Subcutaneous Q24H  . potassium chloride  10 mEq Intravenous Q1 Hr x 4  . sodium chloride flush  10-40 mL Intracatheter Q12H   Continuous Infusions: . dextrose 5 % and 0.45 % NaCl with KCl 40 mEq/L 75 mL/hr at 04/25/16 0751   PRN Meds:.acetaminophen **OR** acetaminophen, RESOURCE THICKENUP CLEAR, sodium chloride flush Assessment/Plan:  Sepsis secondary to Complicated UTI:  Blood cultures growing E. Coli and Enterobacter today. Therefore, his sepsis is attributable to a UTI rather than an aspiration pneumonia. We will adjust the class and duration of his antibiotics accordingly. He has convalesced well. He has a condom catheter in place. - Ceftriaxone IV per pharm last dose 6/20)  Aspiration Risk: Patient is now cleared for a Dysphagia 1 diet. He is also tolerating pills, including Sinemet. 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 Family his requesting a suction device to use at home and to hear about home hospice services.  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 I spoke with Neurology yesterday who recommended a trial of Sinemet, titrating up the levodopa in 50-100 mg increments. His rigidity is somewhat improved with carbidopa-levodopa 10-100, but there is room for improvement. Waxing and waning tremor. The long term effectiveness of this medication can be addressed as an outpatient. Family is requesting that he follow up in the [facility] outpatient clinic. If this is truly vascular parkinsonism, it is less likely to respond to L-DOPA. - Increased to Carbidopa-Levodopa 20-200 TID. Will assess effectiveness this afternoon.  Hypernatremia: Resolved. Sodium 144.  - Discontinue fluids.  Hypokalemia: K 3.0 this morning. Will likely improve as he eats pureed foods. - ordered another 4 runs of KCl 10 mEq  DVT PPx: Lovenox [place]  Diet: Dysphagia  1  Dispo: Anticipated discharge home today  The patient does not have a current PCP, follows with [facility]. He  He does need an [facility] hospital follow-up appointment after discharge.  The patient does have transportation limitations that hinder transportation to clinic appointments.  .Services Needed at time of discharge: Y = Yes, Blank = No PT:   OT:   RN: Medication administration (IV Ceftriaxone via PICC)  Equipment: Suctioning device  Other:     LOS: 4 days   Ruben Im, MD 04/25/2016, 8:40 AM

## 2016-04-26 LAB — CULTURE, BLOOD (ROUTINE X 2): Culture: NO GROWTH

## 2016-04-27 LAB — CULTURE, BLOOD (ROUTINE X 2)

## 2016-04-30 ENCOUNTER — Telehealth: Payer: Self-pay | Admitting: Internal Medicine

## 2016-04-30 NOTE — Telephone Encounter (Signed)
Need TOC discharge dat e 04/25/16 HFU 05/01/16

## 2016-05-01 ENCOUNTER — Ambulatory Visit: Payer: No Typology Code available for payment source | Admitting: Internal Medicine

## 2016-05-02 NOTE — Telephone Encounter (Signed)
No answer

## 2016-05-17 NOTE — Telephone Encounter (Signed)
Has never answered phone and was a no show to his appt

## 2017-03-12 DEATH — deceased

## 2017-07-19 IMAGING — CT CT HEAD W/O CM
3 of 4 series · 15 of 47 positions shown, 18 images · non-contrast
Comparison: 07/19/2014

CLINICAL DATA: 70-year-old male with a history of new right-sided
deficits

EXAM:
CT HEAD WITHOUT CONTRAST
TECHNIQUE: Contiguous axial images were obtained from the base of the skull
through the vertex without intravenous contrast.

[Series 3: head 2.0 h70h · axial · 0.45mm/px · z∈[-130,-0]mm · 9 of 83 slices shown, 12 images]
[im 9/83  brain]
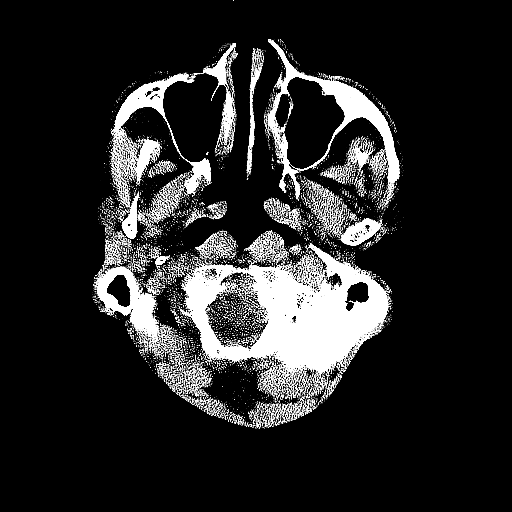
[im 9/83  bone]
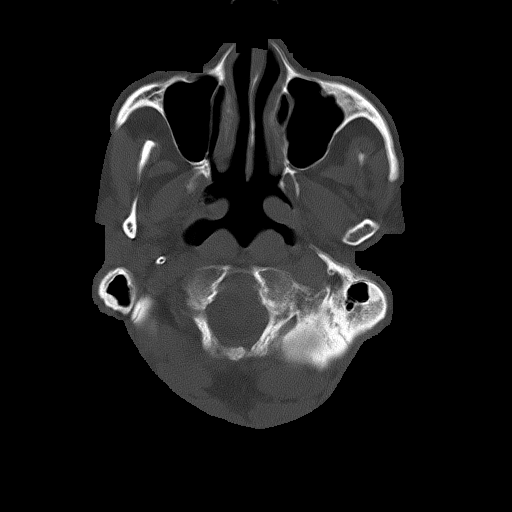
[im 17/83  brain]
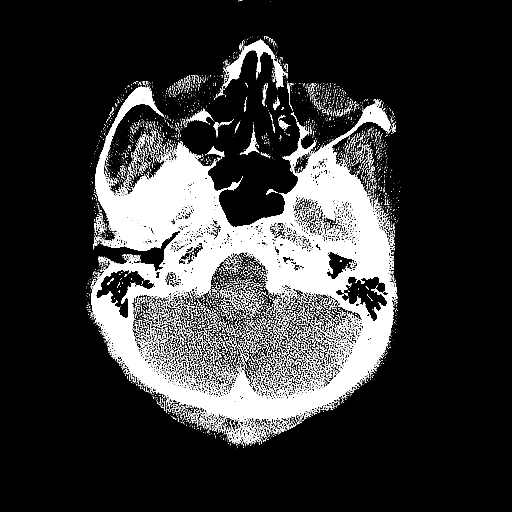
[im 25/83  brain]
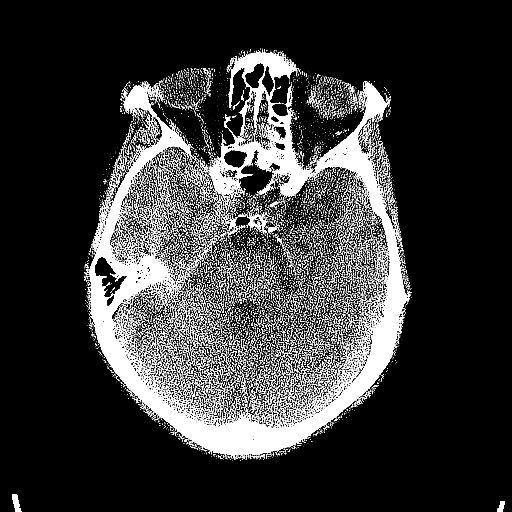
[im 33/83  brain]
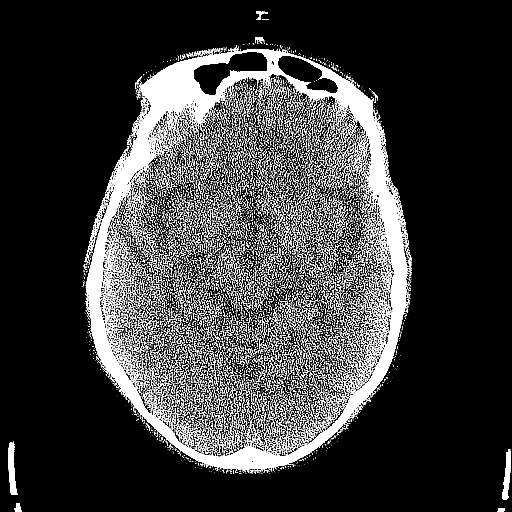
[im 42/83  brain]
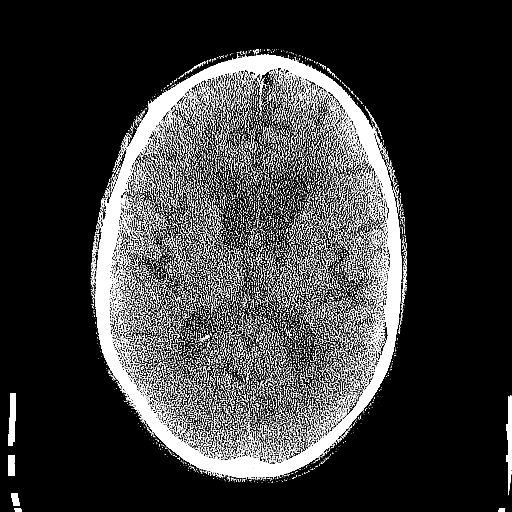
[im 42/83  bone]
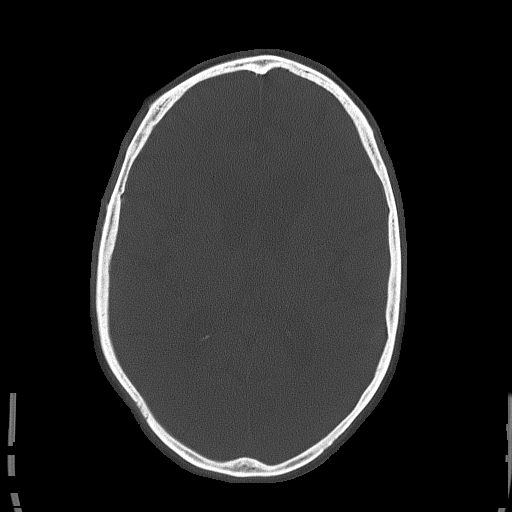
[im 50/83  brain]
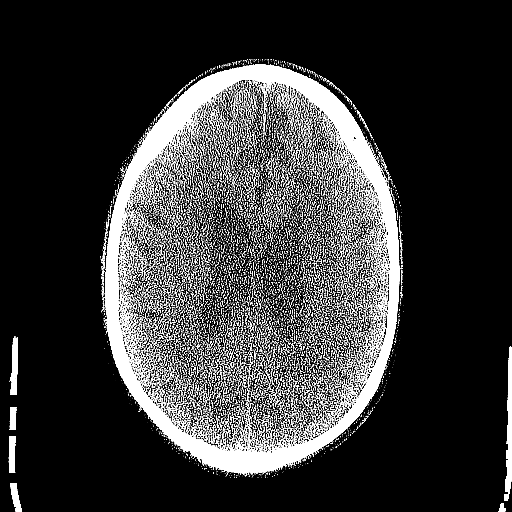
[im 58/83  brain]
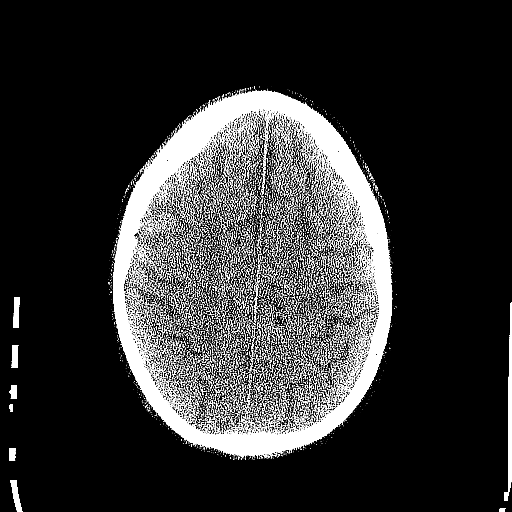
[im 66/83  brain]
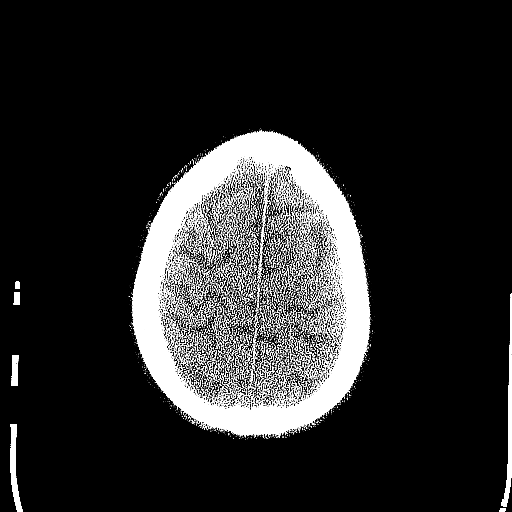
[im 74/83  brain]
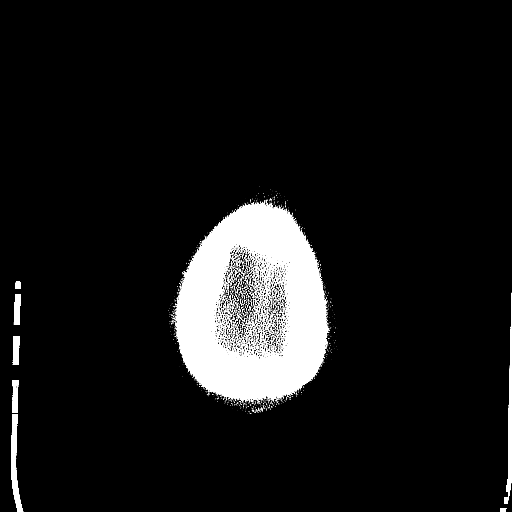
[im 74/83  bone]
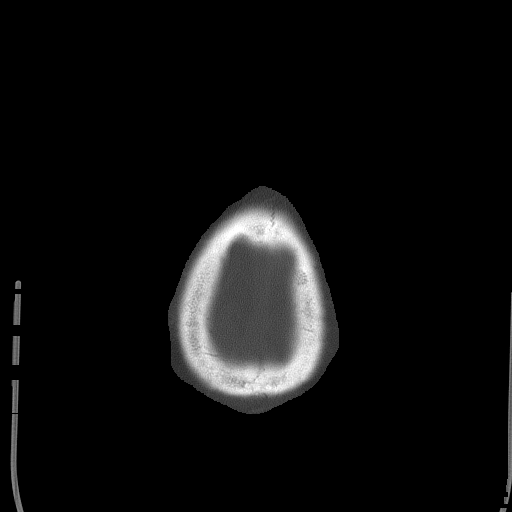

[Series 4: head 3.0 mpr · coronal · 0.32mm/px · 3 of 72 slices shown (1 of 2)]
[im 24/72  brain]
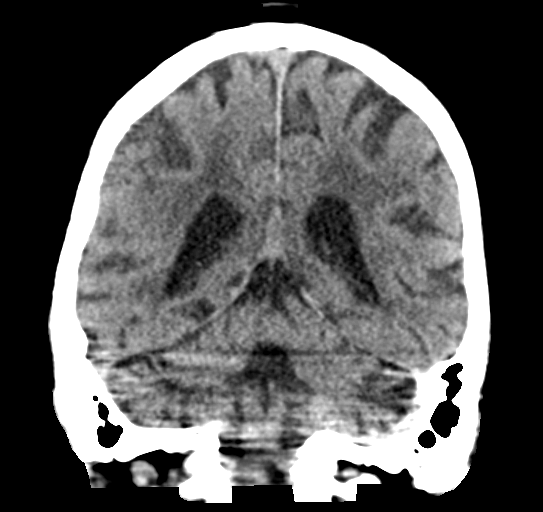
[im 32/72  brain]
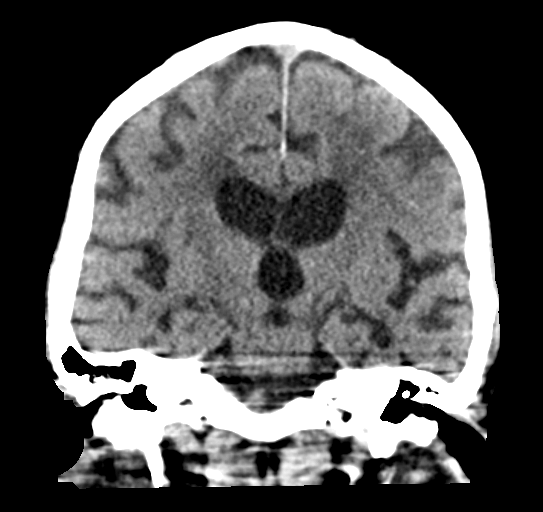
[im 40/72  brain]
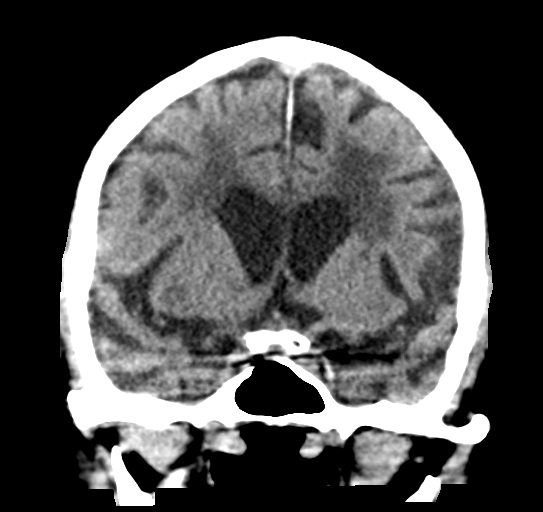

[Series 5: head 3.0 mpr · sagittal · 0.32mm/px · 3 of 55 slices shown (2 of 2)]
[im 19/55  brain]
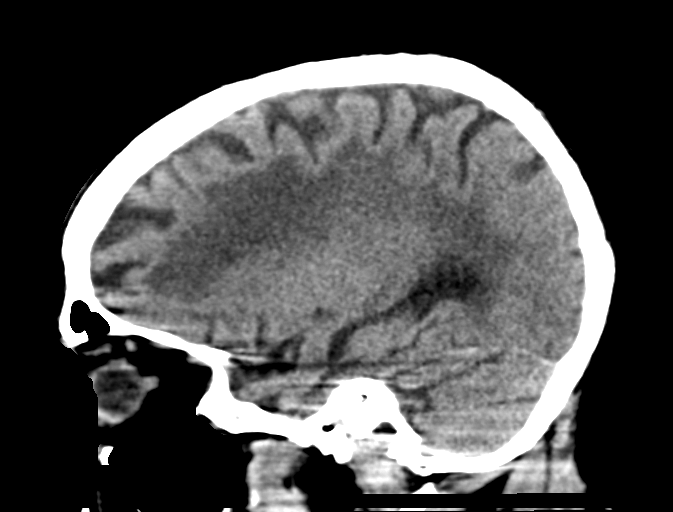
[im 28/55  brain]
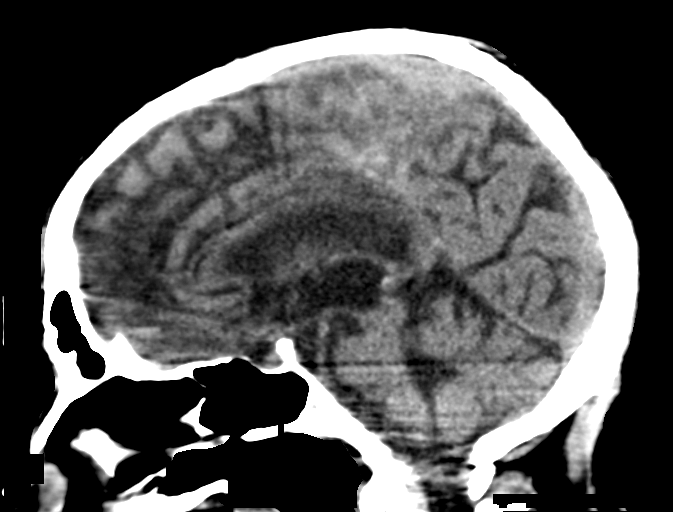
[im 37/55  brain]
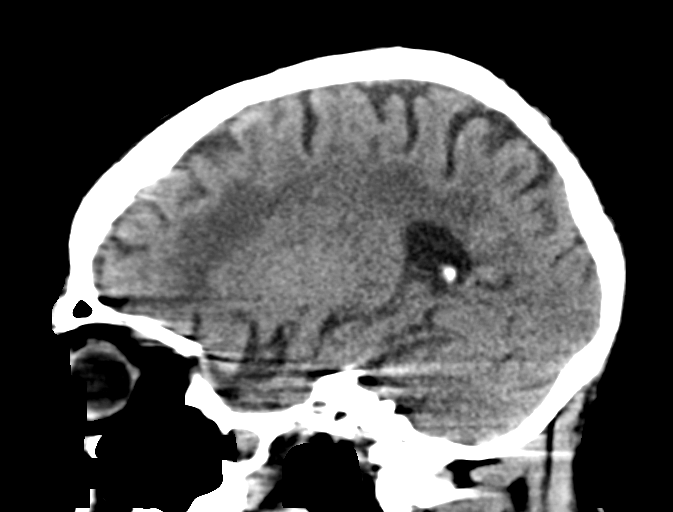

[15 of 47 positions shown; findings below may reference images not displayed]

FINDINGS: Unremarkable appearance of the calvarium without acute fracture or
aggressive lesion.

Unremarkable appearance of the scalp soft tissues.

Unremarkable appearance of the bilateral orbits.

Debris within the bilateral external auditory canal.

Mastoid air cells are clear.

No significant paranasal sinus disease

No acute intracranial hemorrhage.  No midline shift or mass effect.

Similar appearance of diffuse brain volume loss and expansion of the
ventricles.

Confluent hypodensity in the bilateral periventricular white matter
is unchanged.

Intracranial atherosclerosis.
IMPRESSION: No CT evidence of acute intracranial abnormality.

Similar appearance of chronic white matter disease and intracranial
atherosclerosis. Brain volume loss, similar to prior.

## 2017-07-19 IMAGING — CT CT ANGIO CHEST
2 of 6 series · 19 of 36 positions shown · IV contrast (Omni 300)
Comparison: Portable chest obtained earlier today.

CLINICAL DATA: Hypoxia.

EXAM:
CT ANGIOGRAPHY CHEST WITH CONTRAST
TECHNIQUE: Multidetector CT imaging of the chest was performed using the
standard protocol during bolus administration of intravenous
contrast. Multiplanar CT image reconstructions and MIPs were
obtained to evaluate the vascular anatomy.
CONTRAST:  70 cc Isovue 370

[Series 6: pe thins · axial · 0.67mm/px · z∈[-274,-2]mm · 18 of 601 slices shown]
[im 29/601  lung]
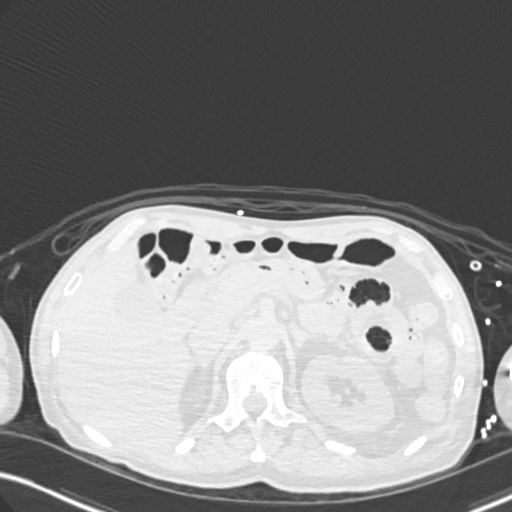
[im 58/601  mediastinal]
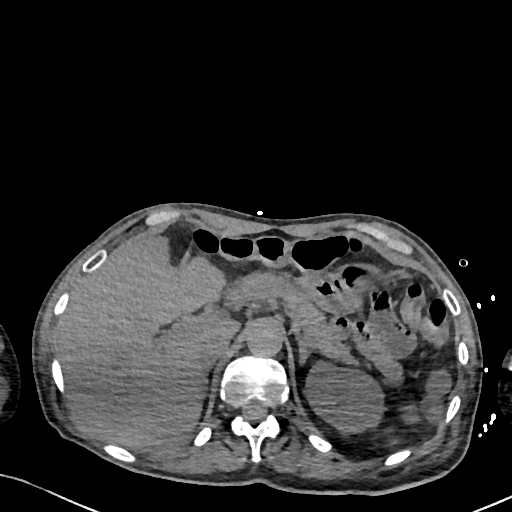
[im 86/601  lung]
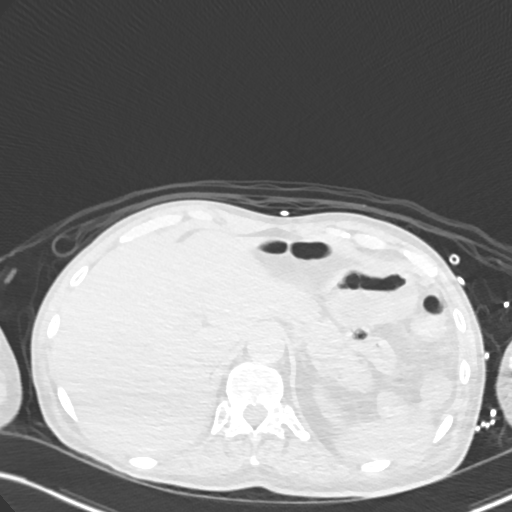
[im 115/601  mediastinal]
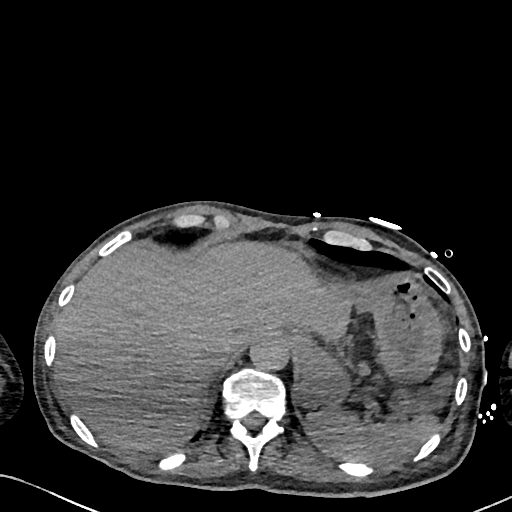
[im 172/601  lung]
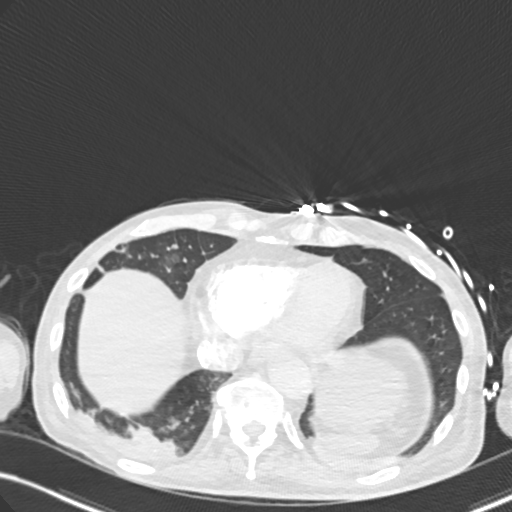
[im 201/601  mediastinal]
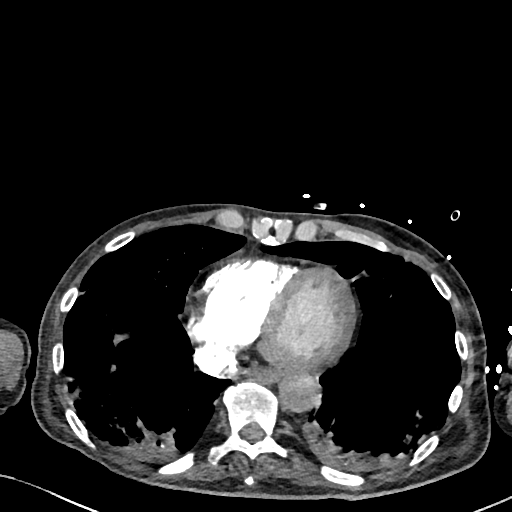
[im 229/601  lung]
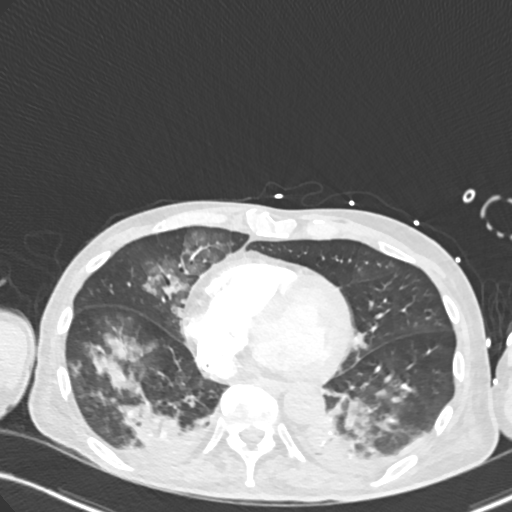
[im 258/601  mediastinal]
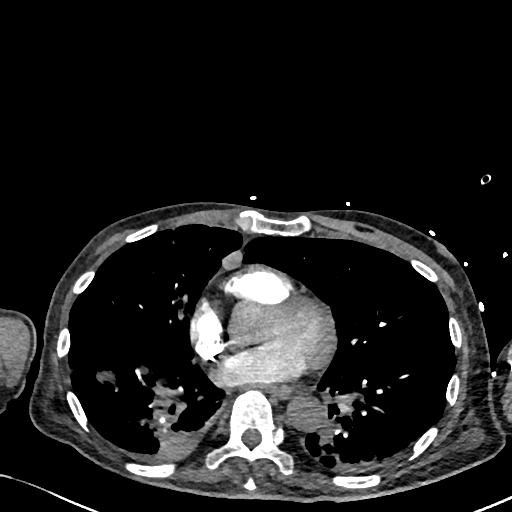
[im 286/601  lung]
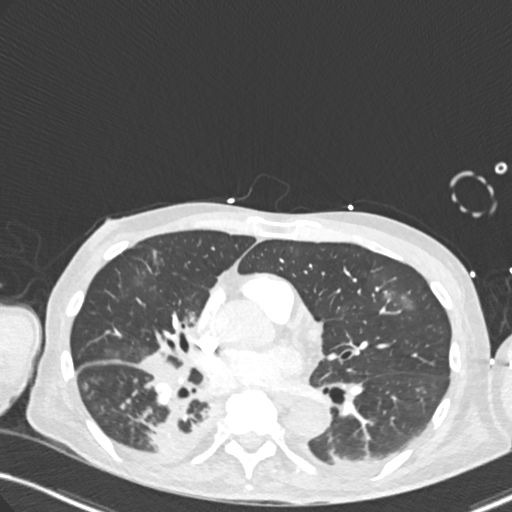
[im 315/601  mediastinal]
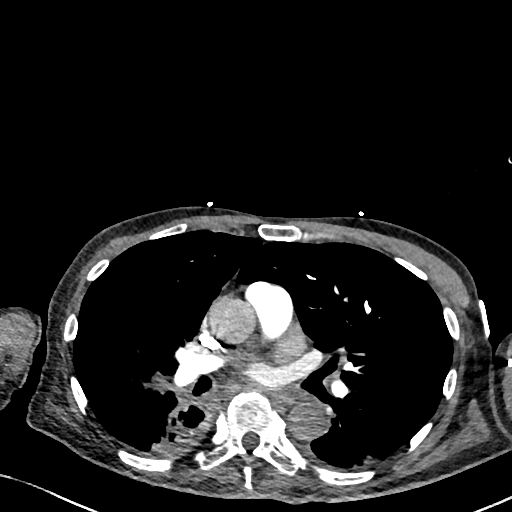
[im 343/601  lung]
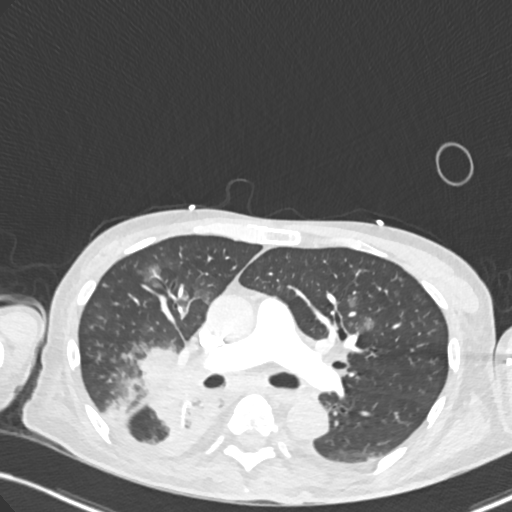
[im 372/601  mediastinal]
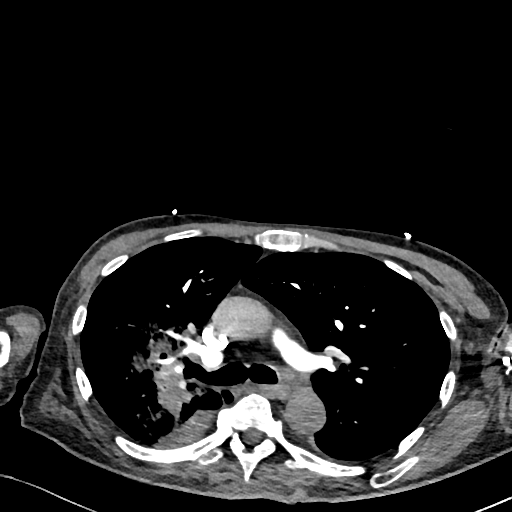
[im 401/601  lung]
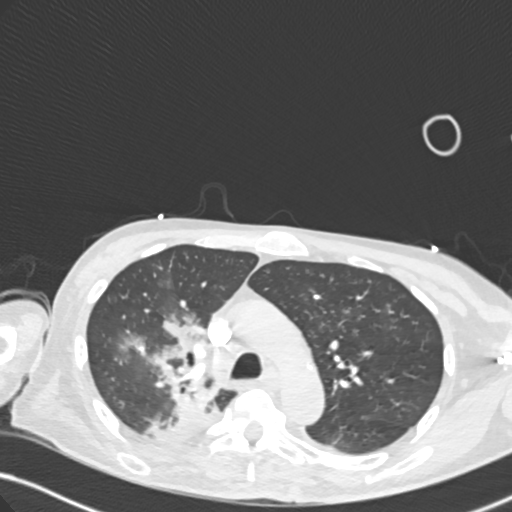
[im 458/601  mediastinal]
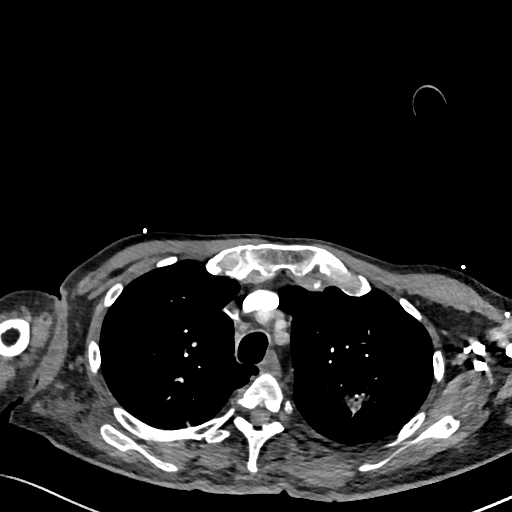
[im 486/601  lung]
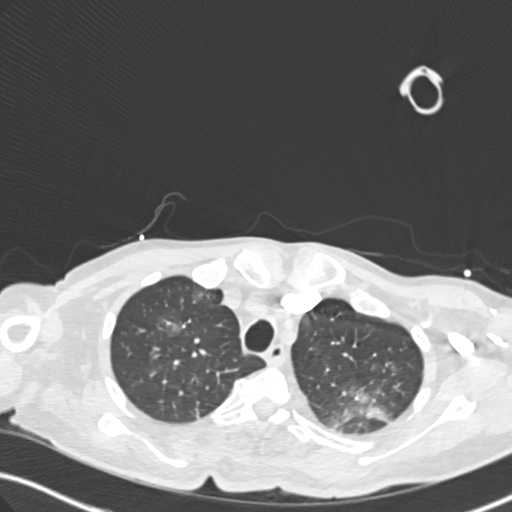
[im 515/601  mediastinal]
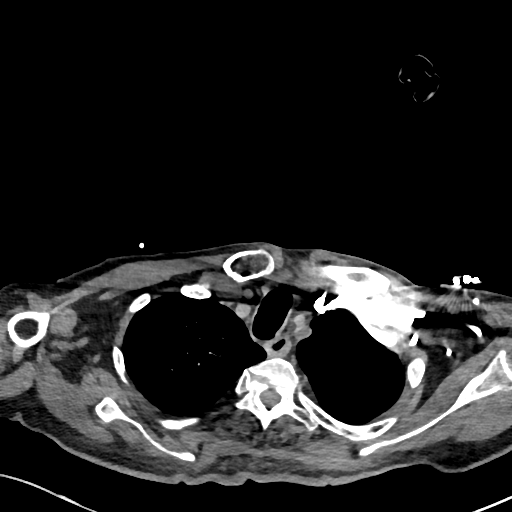
[im 543/601  lung]
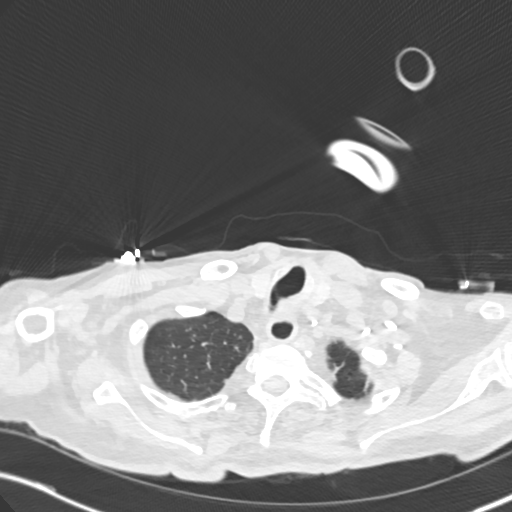
[im 572/601  mediastinal]
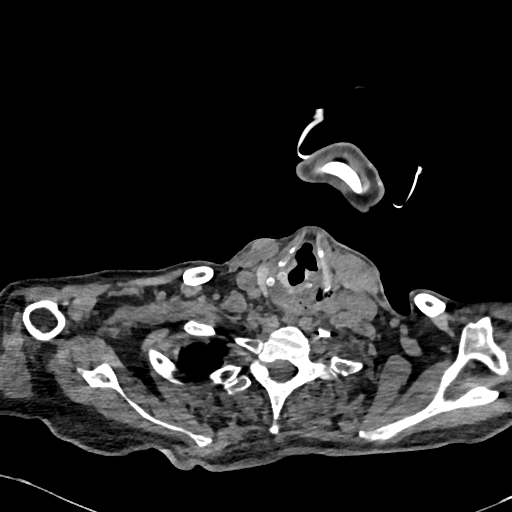

[Series 7: pe 2mm cor · coronal · 0.59mm/px · 1 of 103 slices shown]
[im 52/103  mediastinal]
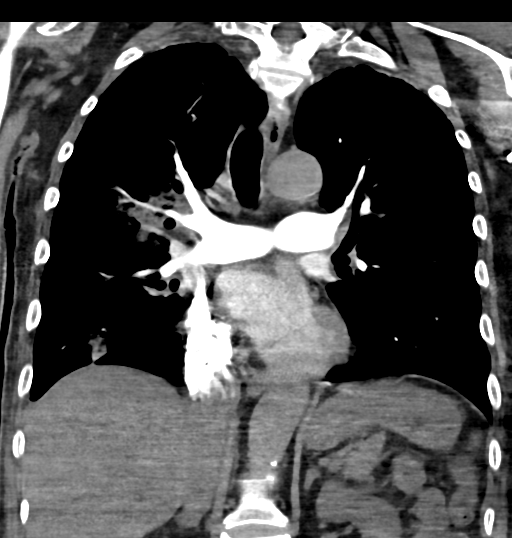

[19 of 36 positions shown; findings below may reference images not displayed]

FINDINGS: Mediastinum/Lymph Nodes: No pulmonary emboli or thoracic aortic
dissection identified. No masses or pathologically enlarged lymph
nodes identified.

Lungs/Pleura: Interval extensive, multifocal, patchy opacity in both
lungs. This is most pronounced and most confluent in the right
perihilar region, involving all 3 lobes, most pronounced involving
the right lower lobe. No pleural fluid.

Upper abdomen: No acute findings.

Musculoskeletal: Thoracic and lower cervical spine degenerative
changes.

Review of the MIP images confirms the above findings.
IMPRESSION: 1. Interval extensive patchy, multifocal and confluent opacity both
lungs, most pronounced in the right perihilar region and right lower
lobe. This is compatible with interval extensive multilobar
pneumonia. This could be due to septic emboli or atypical pneumonia.
2. Normally opacified pulmonary arteries with no pulmonary emboli
seen.
